# Patient Record
Sex: Male | Born: 1945 | Race: White | Hispanic: No | Marital: Married | Smoking: Former smoker
Health system: Southern US, Community
[De-identification: ages and names within clinical notes are randomized; demographics above are authoritative.]

## PROBLEM LIST (undated history)

## (undated) DIAGNOSIS — D696 Thrombocytopenia, unspecified: Secondary | ICD-10-CM

## (undated) DIAGNOSIS — D689 Coagulation defect, unspecified: Secondary | ICD-10-CM

## (undated) DIAGNOSIS — K7460 Unspecified cirrhosis of liver: Secondary | ICD-10-CM

## (undated) HISTORY — PX: CARPAL TUNNEL RELEASE: SHX101

## (undated) HISTORY — PX: COLONOSCOPY: SHX174

---

## 2016-12-02 HISTORY — PX: TIPS PROCEDURE: SHX808

## 2016-12-30 HISTORY — PX: ESOPHAGOGASTRODUODENOSCOPY: SHX1529

## 2017-07-07 ENCOUNTER — Other Ambulatory Visit: Payer: Self-pay | Admitting: Transplant Hepatology

## 2017-07-07 ENCOUNTER — Other Ambulatory Visit (HOSPITAL_COMMUNITY): Payer: Self-pay | Admitting: Transplant Hepatology

## 2017-07-07 DIAGNOSIS — K766 Portal hypertension: Secondary | ICD-10-CM

## 2017-07-09 ENCOUNTER — Ambulatory Visit (HOSPITAL_COMMUNITY)
Admission: RE | Admit: 2017-07-09 | Discharge: 2017-07-09 | Disposition: A | Discharge status: Home / Self Care | Payer: Medicare Other | Source: Ambulatory Visit | Attending: Transplant Hepatology | Admitting: Transplant Hepatology

## 2017-07-09 ENCOUNTER — Other Ambulatory Visit (HOSPITAL_COMMUNITY): Payer: Self-pay | Admitting: Transplant Hepatology

## 2017-07-09 DIAGNOSIS — K7460 Unspecified cirrhosis of liver: Secondary | ICD-10-CM | POA: Diagnosis not present

## 2017-07-09 DIAGNOSIS — I8390 Asymptomatic varicose veins of unspecified lower extremity: Secondary | ICD-10-CM | POA: Insufficient documentation

## 2017-07-09 DIAGNOSIS — K766 Portal hypertension: Secondary | ICD-10-CM

## 2017-07-29 ENCOUNTER — Inpatient Hospital Stay (HOSPITAL_COMMUNITY)
Admission: EM | Admit: 2017-07-29 | Discharge: 2017-08-04 | DRG: 872 | Disposition: E | Payer: Medicare Other | Attending: Internal Medicine | Admitting: Internal Medicine

## 2017-07-29 ENCOUNTER — Other Ambulatory Visit: Payer: Self-pay

## 2017-07-29 ENCOUNTER — Emergency Department (HOSPITAL_COMMUNITY): Payer: Medicare Other

## 2017-07-29 ENCOUNTER — Encounter (HOSPITAL_COMMUNITY): Payer: Self-pay

## 2017-07-29 ENCOUNTER — Other Ambulatory Visit (HOSPITAL_COMMUNITY): Payer: Medicare Other

## 2017-07-29 DIAGNOSIS — B377 Candidal sepsis: Secondary | ICD-10-CM | POA: Diagnosis not present

## 2017-07-29 DIAGNOSIS — R17 Unspecified jaundice: Secondary | ICD-10-CM | POA: Diagnosis not present

## 2017-07-29 DIAGNOSIS — Z825 Family history of asthma and other chronic lower respiratory diseases: Secondary | ICD-10-CM

## 2017-07-29 DIAGNOSIS — D6959 Other secondary thrombocytopenia: Secondary | ICD-10-CM | POA: Diagnosis present

## 2017-07-29 DIAGNOSIS — D696 Thrombocytopenia, unspecified: Secondary | ICD-10-CM | POA: Diagnosis not present

## 2017-07-29 DIAGNOSIS — N179 Acute kidney failure, unspecified: Secondary | ICD-10-CM | POA: Diagnosis present

## 2017-07-29 DIAGNOSIS — K7030 Alcoholic cirrhosis of liver without ascites: Secondary | ICD-10-CM | POA: Diagnosis not present

## 2017-07-29 DIAGNOSIS — K7290 Hepatic failure, unspecified without coma: Secondary | ICD-10-CM | POA: Diagnosis not present

## 2017-07-29 DIAGNOSIS — E875 Hyperkalemia: Secondary | ICD-10-CM | POA: Diagnosis present

## 2017-07-29 DIAGNOSIS — F1020 Alcohol dependence, uncomplicated: Secondary | ICD-10-CM | POA: Diagnosis present

## 2017-07-29 DIAGNOSIS — E872 Acidosis: Secondary | ICD-10-CM | POA: Diagnosis present

## 2017-07-29 DIAGNOSIS — Z801 Family history of malignant neoplasm of trachea, bronchus and lung: Secondary | ICD-10-CM

## 2017-07-29 DIAGNOSIS — K7031 Alcoholic cirrhosis of liver with ascites: Secondary | ICD-10-CM | POA: Diagnosis present

## 2017-07-29 DIAGNOSIS — D539 Nutritional anemia, unspecified: Secondary | ICD-10-CM | POA: Diagnosis not present

## 2017-07-29 DIAGNOSIS — D689 Coagulation defect, unspecified: Secondary | ICD-10-CM | POA: Diagnosis present

## 2017-07-29 DIAGNOSIS — Z87891 Personal history of nicotine dependence: Secondary | ICD-10-CM | POA: Diagnosis not present

## 2017-07-29 DIAGNOSIS — E871 Hypo-osmolality and hyponatremia: Secondary | ICD-10-CM | POA: Diagnosis present

## 2017-07-29 DIAGNOSIS — Z8249 Family history of ischemic heart disease and other diseases of the circulatory system: Secondary | ICD-10-CM

## 2017-07-29 DIAGNOSIS — R6510 Systemic inflammatory response syndrome (SIRS) of non-infectious origin without acute organ dysfunction: Secondary | ICD-10-CM | POA: Diagnosis not present

## 2017-07-29 DIAGNOSIS — I119 Hypertensive heart disease without heart failure: Secondary | ICD-10-CM | POA: Diagnosis present

## 2017-07-29 DIAGNOSIS — Z66 Do not resuscitate: Secondary | ICD-10-CM | POA: Diagnosis present

## 2017-07-29 DIAGNOSIS — E876 Hypokalemia: Secondary | ICD-10-CM | POA: Diagnosis not present

## 2017-07-29 DIAGNOSIS — R0902 Hypoxemia: Secondary | ICD-10-CM | POA: Diagnosis present

## 2017-07-29 DIAGNOSIS — Z95828 Presence of other vascular implants and grafts: Secondary | ICD-10-CM

## 2017-07-29 DIAGNOSIS — K7581 Nonalcoholic steatohepatitis (NASH): Secondary | ICD-10-CM | POA: Diagnosis present

## 2017-07-29 DIAGNOSIS — R0602 Shortness of breath: Secondary | ICD-10-CM | POA: Diagnosis not present

## 2017-07-29 DIAGNOSIS — R509 Fever, unspecified: Secondary | ICD-10-CM

## 2017-07-29 HISTORY — DX: Thrombocytopenia, unspecified: D69.6

## 2017-07-29 HISTORY — DX: Coagulation defect, unspecified: D68.9

## 2017-07-29 HISTORY — DX: Unspecified cirrhosis of liver: K74.60

## 2017-07-29 LAB — COMPREHENSIVE METABOLIC PANEL
ALT: 48 U/L (ref 17–63)
AST: 105 U/L — AB (ref 15–41)
Albumin: 2.4 g/dL — ABNORMAL LOW (ref 3.5–5.0)
Alkaline Phosphatase: 137 U/L — ABNORMAL HIGH (ref 38–126)
Anion gap: 12 (ref 5–15)
BILIRUBIN TOTAL: 8.2 mg/dL — AB (ref 0.3–1.2)
BUN: 24 mg/dL — AB (ref 6–20)
CHLORIDE: 98 mmol/L — AB (ref 101–111)
CO2: 17 mmol/L — ABNORMAL LOW (ref 22–32)
CREATININE: 1.43 mg/dL — AB (ref 0.61–1.24)
Calcium: 9 mg/dL (ref 8.9–10.3)
GFR calc Af Amer: 55 mL/min — ABNORMAL LOW (ref >60)
GFR, EST NON AFRICAN AMERICAN: 47 mL/min — AB (ref >60)
GLUCOSE: 111 mg/dL — AB (ref 65–99)
Potassium: 6.1 mmol/L — ABNORMAL HIGH (ref 3.5–5.1)
Sodium: 127 mmol/L — ABNORMAL LOW (ref 135–145)
Total Protein: 6.6 g/dL (ref 6.5–8.1)

## 2017-07-29 LAB — URINALYSIS, ROUTINE W REFLEX MICROSCOPIC
BACTERIA UA: NONE SEEN
BILIRUBIN URINE: NEGATIVE
Glucose, UA: NEGATIVE mg/dL
KETONES UR: NEGATIVE mg/dL
LEUKOCYTES UA: NEGATIVE
Nitrite: NEGATIVE
Protein, ur: NEGATIVE mg/dL
Specific Gravity, Urine: 1.015 (ref 1.005–1.030)
pH: 5 (ref 5.0–8.0)

## 2017-07-29 LAB — CBC
HCT: 25.9 % — ABNORMAL LOW (ref 39.0–52.0)
Hemoglobin: 8.9 g/dL — ABNORMAL LOW (ref 13.0–17.0)
MCH: 35.3 pg — ABNORMAL HIGH (ref 26.0–34.0)
MCHC: 34.4 g/dL (ref 30.0–36.0)
MCV: 102.8 fL — AB (ref 78.0–100.0)
PLATELETS: 86 10*3/uL — AB (ref 150–400)
RBC: 2.52 MIL/uL — ABNORMAL LOW (ref 4.22–5.81)
RDW: 15.2 % (ref 11.5–15.5)
WBC: 16.5 10*3/uL — AB (ref 4.0–10.5)

## 2017-07-29 LAB — INFLUENZA PANEL BY PCR (TYPE A & B)
INFLAPCR: NEGATIVE
INFLBPCR: NEGATIVE

## 2017-07-29 LAB — I-STAT CG4 LACTIC ACID, ED: LACTIC ACID, VENOUS: 5.91 mmol/L — AB (ref 0.5–1.9)

## 2017-07-29 LAB — PROTIME-INR
INR: 2.76
Prothrombin Time: 29 seconds — ABNORMAL HIGH (ref 11.4–15.2)

## 2017-07-29 LAB — CREATININE, URINE, RANDOM: Creatinine, Urine: 161.07 mg/dL

## 2017-07-29 LAB — LIPASE, BLOOD: LIPASE: 27 U/L (ref 11–51)

## 2017-07-29 LAB — AMMONIA: Ammonia: 50 umol/L — ABNORMAL HIGH (ref 9–35)

## 2017-07-29 LAB — SODIUM, URINE, RANDOM: Sodium, Ur: 37 mmol/L

## 2017-07-29 MED ORDER — LIDOCAINE HCL (PF) 1 % IJ SOLN
INTRAMUSCULAR | Status: AC
  Filled 2017-07-29: qty 5

## 2017-07-29 MED ORDER — PIPERACILLIN-TAZOBACTAM 3.375 G IVPB 30 MIN
3.3750 g | Freq: Once | INTRAVENOUS | Status: AC
  Administered 2017-07-29: 3.375 g via INTRAVENOUS
  Filled 2017-07-29: qty 50

## 2017-07-29 MED ORDER — PIPERACILLIN-TAZOBACTAM 3.375 G IVPB
3.3750 g | Freq: Three times a day (TID) | INTRAVENOUS | Status: DC
  Administered 2017-07-30 (×3): 3.375 g via INTRAVENOUS
  Filled 2017-07-29 (×4): qty 50

## 2017-07-29 MED ORDER — LACTULOSE 10 GM/15ML PO SOLN
10.0000 g | Freq: Every day | ORAL | Status: DC
  Administered 2017-07-30: 10 g via ORAL
  Filled 2017-07-29: qty 15

## 2017-07-29 MED ORDER — VANCOMYCIN HCL IN DEXTROSE 750-5 MG/150ML-% IV SOLN
750.0000 mg | Freq: Two times a day (BID) | INTRAVENOUS | Status: DC
  Administered 2017-07-29 – 2017-07-30 (×3): 750 mg via INTRAVENOUS
  Filled 2017-07-29 (×4): qty 150

## 2017-07-29 MED ORDER — HYDRALAZINE HCL 20 MG/ML IJ SOLN: 10.0000 mg | INTRAMUSCULAR | Status: DC | PRN

## 2017-07-29 MED ORDER — MAGNESIUM OXIDE 400 (241.3 MG) MG PO TABS
400.0000 mg | ORAL_TABLET | Freq: Every day | ORAL | Status: DC
  Administered 2017-07-30: 400 mg via ORAL
  Filled 2017-07-29: qty 1

## 2017-07-29 MED ORDER — ACETAMINOPHEN 325 MG PO TABS
325.0000 mg | ORAL_TABLET | Freq: Once | ORAL | Status: AC
  Administered 2017-07-29: 325 mg via ORAL
  Filled 2017-07-29: qty 1

## 2017-07-29 MED ORDER — ACETAMINOPHEN 650 MG RE SUPP
325.0000 mg | Freq: Four times a day (QID) | RECTAL | Status: DC | PRN
Start: 2017-07-29 — End: 2017-07-30

## 2017-07-29 MED ORDER — ONDANSETRON HCL 4 MG/2ML IJ SOLN: 4.0000 mg | Freq: Four times a day (QID) | INTRAMUSCULAR | Status: DC | PRN

## 2017-07-29 MED ORDER — GABAPENTIN 300 MG PO CAPS
300.0000 mg | ORAL_CAPSULE | Freq: Three times a day (TID) | ORAL | Status: DC
  Administered 2017-07-30 (×4): 300 mg via ORAL
  Filled 2017-07-29 (×4): qty 1

## 2017-07-29 MED ORDER — ACETAMINOPHEN 325 MG PO TABS
325.0000 mg | ORAL_TABLET | Freq: Four times a day (QID) | ORAL | Status: DC | PRN
  Administered 2017-07-30: 325 mg via ORAL
  Filled 2017-07-29: qty 1

## 2017-07-29 MED ORDER — LIDOCAINE HCL (PF) 1 % IJ SOLN: 2.0000 mL | Freq: Once | INTRAMUSCULAR | Status: DC

## 2017-07-29 MED ORDER — SODIUM CHLORIDE 0.9 % IV BOLUS (SEPSIS)
500.0000 mL | Freq: Once | INTRAVENOUS | Status: AC
  Administered 2017-07-29: 500 mL via INTRAVENOUS

## 2017-07-29 MED ORDER — RIFAXIMIN 550 MG PO TABS
550.0000 mg | ORAL_TABLET | Freq: Two times a day (BID) | ORAL | Status: DC
  Administered 2017-07-30 (×3): 550 mg via ORAL
  Filled 2017-07-29 (×4): qty 1

## 2017-07-29 MED ORDER — ONDANSETRON HCL 4 MG PO TABS: 4.0000 mg | ORAL_TABLET | Freq: Four times a day (QID) | ORAL | Status: DC | PRN

## 2017-07-29 MED ORDER — METOPROLOL TARTRATE 25 MG PO TABS
25.0000 mg | ORAL_TABLET | Freq: Two times a day (BID) | ORAL | Status: DC
  Administered 2017-07-30 (×3): 25 mg via ORAL
  Filled 2017-07-29 (×3): qty 1

## 2017-07-29 NOTE — ED Notes (Signed)
Pt went to PCP office today because of weak/fatigued feeling since Monday.

## 2017-07-29 NOTE — ED Notes (Signed)
Pt given ice chips, ok by EDP 

## 2017-07-29 NOTE — Progress Notes (Signed)
Pharmacy Antibiotic Note  Christian Swanson is a 71 y.o. male admitted on 07-21-17 with sepsis.    Plan: Zosyn 3.375 gm iv q8h Vanc 750 q12 Monitor renal fx cx vt prn  Height: 5' 6.5" (168.9 cm) Weight: 184 lb 8 oz (83.7 kg) IBW/kg (Calculated) : 64.95  Temp (24hrs), Avg:99.8 F (37.7 C), Min:99.1 F (37.3 C), Max:100.4 F (38 C)  Recent Labs  Lab 01/30/17 1747  WBC 16.5*  CREATININE 1.43*    Estimated Creatinine Clearance: 47.2 mL/min (A) (by C-G formula based on SCr of 1.43 mg/dL (H)).    No Known Allergies  Christian Swanson, PharmD, BCPS, BCCCP Clinical Pharmacist Clinical phone for 07-21-17 from 1430 [phone]- [phone]: [phone] If after 2300, please call main pharmacy at: [phone] 07-21-17 8:38 PM

## 2017-07-29 NOTE — ED Triage Notes (Signed)
Per Pt, PT is coming from PCP with complaints of abnormal labs secondary to being treated for liver disease. Reports some nausea, but no vomiting. Sodium is 129. Potassium 6.4, AST 93, Bilirubin 9.0. Pt denies chest pain.

## 2017-07-29 NOTE — ED Provider Notes (Signed)
[facility] Provider Note   CSN: [id number] Arrival date & time: 2017-04-12  1709     History   Chief Complaint Chief Complaint  Patient presents with  . Abnormal Lab  . Shortness of Breath    HPI Christian Swanson is a 71 y.o. male.  HPI  Patient 71 year old male with past medical history significant for alcoholic cirrhosis.  Patient is being managed [facility].  Patient has status post TIPS procedure.  Patient unfortunately is not a candidate for transplant given his age.  He is been managed pretty well in the past.  In his most recent appointment which was 1 month ago, patient's T bili was trending up from 3 to 6.  In addition patient's INR was steady at 2.3 and creatinine was normal at 0.7.  Patient's been feeling weak, tired, started to feel shaky and febrile today.  Went to outpatient clinic got labs drawn was sent here to the emergency department.  Patient had no focal abdominal pain or other symptoms.  Unfortunately today patient's T bili has increased from 6 to 9.  His creatinine has gone from .7 to 1.4.  Patient is febrile.  We will send labs.  However with INR elevated at 2.4 and platelets at 85. We will treat with broad-spectrum antibiotics.  Past Medical History:  Diagnosis Date  . Cirrhosis (HCC)     There are no active problems to display for this patient.   Past Surgical History:  Procedure Laterality Date  . TIPS PROCEDURE         Home Medications    Prior to Admission medications   Medication Sig Start Date End Date Taking? Authorizing Provider  aMILoride (MIDAMOR) 5 MG tablet Take 10 mg by mouth every morning.    Yes [provider]  Cholecalciferol (VITAMIN D3) 5000 units TABS Take 5,000 Units by mouth daily.   Yes [provider]  furosemide (LASIX) 20 MG tablet Take 20 mg by mouth daily.   Yes [provider]  gabapentin (NEURONTIN) 300 MG capsule  Take 300 mg by mouth 3 (three) times daily.   Yes [provider]  lactulose (CHRONULAC) 10 GM/15ML solution Take 15 mLs by mouth at bedtime.   Yes [provider]  lisinopril (PRINIVIL,ZESTRIL) 10 MG tablet Take 10 mg by mouth daily.   Yes [provider]  magnesium oxide (MAG-OX) 400 MG tablet Take 400 mg by mouth daily.   Yes [provider]  metoprolol tartrate (LOPRESSOR) 25 MG tablet Take 25 mg by mouth 2 (two) times daily. 07/10/17 07/10/18 Yes [provider]  rifaximin (XIFAXAN) 550 MG TABS tablet Take 550 mg by mouth 2 (two) times daily. 01/30/17  Yes [provider]    Family History No family history on file.  Social History Social History   Tobacco Use  . Smoking status: Former Games developermoker  . Smokeless tobacco: Never Used  Substance Use Topics  . Alcohol use: No    Frequency: Never  . Drug use: No     Allergies   Patient has no known allergies.   Review of Systems Review of Systems  Constitutional: Positive for chills, fatigue and fever.  Respiratory: Negative for chest tightness.   Gastrointestinal: Negative for abdominal pain.  Neurological: Positive for light-headedness. Negative for dizziness.  All other systems reviewed and are negative.    Physical Exam Updated Vital Signs BP 111/60   Pulse 84   Temp Marland Kitchen(!)  100.4 F (38 C) (Oral)   Resp (!) 22   Ht 5' 6.5" (1.689 m)   Wt 83.7 kg (184 lb 8 oz)   SpO2 (!) 83%   BMI 29.33 kg/m   Physical Exam  Constitutional: He is oriented to person, place, and time. He appears well-nourished.  71 year old jaundiced male.  HENT:  Head: Normocephalic.  Eyes: Conjunctivae are normal. Pupils are equal, round, and reactive to light.  Mild icterus.  Cardiovascular: Normal rate, regular rhythm and normal heart sounds.  Pulmonary/Chest: Effort normal. No respiratory distress.  Abdominal: Soft. He exhibits ascites. He exhibits no distension.  No abdominal tenderness,  enlarged liver.  Musculoskeletal:       Right lower leg: He exhibits no edema.       Left lower leg: He exhibits no edema.  Neurological: He is oriented to person, place, and time.  Skin: Skin is warm and dry. He is not diaphoretic.  Psychiatric: He has a normal mood and affect. His behavior is normal.     ED Treatments / Results  Labs (all labs ordered are listed, but only abnormal results are displayed) Labs Reviewed  COMPREHENSIVE METABOLIC PANEL - Abnormal; Notable for the following components:      Result Value   Sodium 127 (*)    Potassium 6.1 (*)    Chloride 98 (*)    CO2 17 (*)    Glucose, Bld 111 (*)    BUN 24 (*)    Creatinine, Ser 1.43 (*)    Albumin 2.4 (*)    AST 105 (*)    Alkaline Phosphatase 137 (*)    Total Bilirubin 8.2 (*)    GFR calc non Af Amer 47 (*)    GFR calc Af Amer 55 (*)    All other components within normal limits  CBC - Abnormal; Notable for the following components:   WBC 16.5 (*)    RBC 2.52 (*)    Hemoglobin 8.9 (*)    HCT 25.9 (*)    MCV 102.8 (*)    MCH 35.3 (*)    Platelets 86 (*)    All other components within normal limits  CULTURE, BLOOD (ROUTINE X 2)  CULTURE, BLOOD (ROUTINE X 2)  URINE CULTURE  LIPASE, BLOOD  INFLUENZA PANEL BY PCR (TYPE A & B)  URINALYSIS, ROUTINE W REFLEX MICROSCOPIC  AMMONIA  PROTIME-INR  I-STAT CG4 LACTIC ACID, ED    EKG  EKG Interpretation None       Radiology Dg Chest 2 View  Result Date: 2017/05/30 CLINICAL DATA:  liver disease, nausea, shortness of breath EXAM: CHEST  2 VIEW COMPARISON:  None available FINDINGS: Mild cardiomegaly with central vascular congestion. No focal pneumonia, collapse or consolidation. Negative for edema, effusion or pneumothorax. Trachea is midline. Minor thoracic spondylosis. Patient is status post TIPS. IMPRESSION: Cardiomegaly with mild vascular congestion No acute chest process. Electronically Signed   By: Judie PetitM.  Shick M.D.   On: 2017/05/30 18:32     Procedures Procedures (including critical care time)  CRITICAL CARE Performed by: Arlana Hoveourteney L Amberia Bayless Total critical care time: 45 minutes Critical care time was exclusive of separately billable procedures and treating other patients. Critical care was necessary to treat or prevent imminent or life-threatening deterioration. Critical care was time spent personally by me on the following activities: development of treatment plan with patient and/or surrogate as well as nursing, discussions with consultants, evaluation of patient's response to treatment, examination of patient, obtaining history from patient or surrogate,  ordering and performing treatments and interventions, ordering and review of laboratory studies, ordering and review of radiographic studies, pulse oximetry and re-evaluation of patient's condition.   Medications Ordered in ED Medications  sodium chloride 0.9 % bolus 500 mL (not administered)     Initial Impression / Assessment and Plan / ED Course  I have reviewed the triage vital signs and the nursing notes.  Pertinent labs & imaging results that were available during my care of the patient were reviewed by me and considered in my medical decision making (see chart for details).    Patient 71 year old male with past medical history significant for alcoholic cirrhosis.  Patient is being managed by Dr. Clelia Croft at [facility] hepatology clinic.  Patient has status post TIPS procedure.  Patient unfortunately is not a candidate for transplant given his age.  He is been managed pretty well in the past.  In his most recent appointment which was 1 month ago, patient's T bili was trending up from 3 to 6.  In addition patient's INR was steady at 2.3 and creatinine was normal at 0.7.  Patient's been feeling weak, tired, started to feel shaky and febrile today.  Went to outpatient clinic got labs drawn was sent here to the emergency department.  Patient had no focal abdominal pain  or other symptoms.  Unfortunately today patient's T bili has increased from 6 to 9.  His creatinine has gone from .7 to 1.4.  Patient is febrile.  We will send labs.  However with INR elevated at 2.4 and platelets at 85.  We will treat with broad-spectrum antibiotics.  10:12 PM Patient has a high INR and platelets, however we did an ultrasound to make sure the patient did not have any fluid that I could test.  Because we have been unable to find a source for his fever and white count.  However there is no pocket of fluid that I can visualize with ultrasound.  We will continue with these broad-spectrum antibiotics and reassess.  Final Clinical Impressions(s) / ED Diagnoses   Final diagnoses:  None    ED Discharge Orders    None       Abelino Derrick, MD 07/16/2017 2312

## 2017-07-29 NOTE — ED Notes (Signed)
Dr.Krakakandy at bedside  

## 2017-07-29 NOTE — ED Notes (Signed)
Pt oxygen saturation noted to be 84%, Pt placed on 2L [place]

## 2017-07-29 NOTE — H&P (Addendum)
History and Physical    Christian Jacquetatrick Bohle ZOX:[id number] DOB: 1946-04-01 DOA: 2017/04/02  PCP: Daiva NakayamaPa, [facility]  Patient coming from: Home.   Chief Complaint: Weakness fatigue and fever chills.  HPI: Christian Swanson is a 71 y.o. 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 Sher[facility]ll [facility] presents to the ER because of fever chills fatigue and weakness last 2 days.  Denies any nausea vomiting abdominal pain diarrhea chest pain shortness of breath productive cough.  Patient states he has been taking his medications as advised and has not had any new changes.  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  Subsequently patient underwent TIPS procedure.  Also had liver biopsy.  Patient since then has stopped drinking alcohol.  Being [facility] hepat[facility] [place].  ED Course: In the ER patient's labs reveal a lactate of 5.9.  Potassium was 6.1 sodium of 127 creatinine of 1.4 which is increased from 0.7 in June of this year.  Patient's platelets around 86 with hemoglobin around 8.9.  WBC was 16.5.  AST was 105 ALT 48 total bilirubin is 8.2.  Patient was febrile with temperatures around 102 F.  Blood cultures were obtained and empiric antibiotics were started for possible developing sepsis.  ER physician had done a bedside sonogram which did not show any pockets for paracentesis.  Chest x-ray was unremarkable UA did not show any signs of infection.  Patient received 1 L fluid bolus.  ER physician has discussed with on-call gastroenterologist Dr. Myrtie Neitheranis who will be seeing patient in consult.  Review of Systems: As per HPI, rest all negative.   Past Medical History:  Diagnosis Date  . Cirrhosis [facility](HCC)     Past Surgical History:  Procedure Laterality Date  . TIPS PROCEDURE       reports that he has quit smoking.  he has never used smokeless tobacco. He reports that he does not drink alcohol or use drugs.  No Known Allergies  Family history -COPD and CAD in his father.  Heart disease in his brother.  Hyperlipidemia in his brother.  Lung cancer in his father.  Prior to Admission medications   Medication Sig Start Date End Date Taking? Authorizing Provider  aMILoride (MIDAMOR) 5 MG tablet Take 10 mg by mouth every morning.    Yes [provider]  Cholecalciferol (VITAMIN D3) 5000 units TABS Take 5,000 Units by mouth daily.   Yes [provider]  furosemide (LASIX) 20 MG tablet Take 20 mg by mouth daily.   Yes [provider]  gabapentin (NEURONTIN) 300 MG capsule Take 300 mg by mouth 3 (three) times daily.   Yes [provider]  lactulose (CHRONULAC) 10 GM/15ML solution Take 15 mLs by mouth at bedtime.   Yes [provider]  lisinopril (PRINIVIL,ZESTRIL) 10 MG tablet Take 10 mg by mouth daily.   Yes [provider]  magnesium oxide (MAG-OX) 400 MG tablet Take 400 mg by mouth daily.   Yes [provider]  metoprolol tartrate (LOPRESSOR) 25 MG tablet Take 25 mg by mouth 2 (two) times daily. 07/10/17 07/10/18 Yes [provider]  rifaximin (XIFAXAN) 550 MG TABS tablet Take 550 mg by mouth 2 (two) times daily. 01/30/17  Yes [provider]    Physical Exam: Vitals:   Jan 18, 2017 2200 Jan 18, 2017 2230 Jan 18, 2017  2300 August 02, 2017 2305  BP: (!) 150/58 (!) 150/52 (!) 144/58   Pulse: 86 87 89   Resp: (!) 24 (!) 21 (!) 24   Temp:    (!) 102 F (38.9 C)  TempSrc:    Oral  SpO2: 98% 93% 100%   Weight:      Height:          Constitutional: Moderately built and nourished. Vitals:   02-Aug-2017 2200 2017-08-02 2230 Aug 02, 2017 2300 08/02/17 2305  BP: (!) 150/58 (!) 150/52 (!) 144/58   Pulse: 86 87 89   Resp: (!) 24 (!) 21 (!) 24   Temp:    (!) 102 F (38.9 C)  TempSrc:    Oral  SpO2: 98% 93% 100%   Weight:      Height:       Eyes: Mild  icterus no pallor. ENMT: No discharge from the ears eyes nose or mouth. Neck: No JVD appreciated no mass felt. Respiratory: No rhonchi or crepitations. Cardiovascular: S1-S2 heard no murmurs appreciated. Abdomen: Soft nontender bowel sounds present.  No definite ascites appreciated. Musculoskeletal: Bilateral lower extremity edema. Skin: No rash. Neurologic: Alert awake oriented to time place and person.  Moves all extremities. Psychiatric: Appears normal.  Normal affect.   Labs on Admission: I have personally reviewed following labs and imaging studies  CBC: Recent Labs  Lab 2017/08/02 1747  WBC 16.5*  HGB 8.9*  HCT 25.9*  MCV 102.8*  PLT 86*   Basic Metabolic Panel: Recent Labs  Lab 08-02-17 1747  NA 127*  K 6.1*  CL 98*  CO2 17*  GLUCOSE 111*  BUN 24*  CREATININE 1.43*  CALCIUM 9.0   GFR: Estimated Creatinine Clearance: 47.2 mL/min (A) (by C-G formula based on SCr of 1.43 mg/dL (H)). Liver Function Tests: Recent Labs  Lab 08/02/17 1747  AST 105*  ALT 48  ALKPHOS 137*  BILITOT 8.2*  PROT 6.6  ALBUMIN 2.4*   Recent Labs  Lab 2017/08/02 1747  LIPASE 27   Recent Labs  Lab August 02, 2017 2049  AMMONIA 50*   Coagulation Profile: Recent Labs  Lab 08-02-17 2049  INR 2.76   Cardiac Enzymes: No results for input(s): CKTOTAL, CKMB, CKMBINDEX, TROPONINI in the last 168 hours. BNP (last 3 results) No results for input(s): PROBNP in the last 8760 hours. HbA1C: No results for input(s): HGBA1C in the last 72 hours. CBG: No results for input(s): GLUCAP in the last 168 hours. Lipid Profile: No results for input(s): CHOL, HDL, LDLCALC, TRIG, CHOLHDL, LDLDIRECT in the last 72 hours. Thyroid Function Tests: No results for input(s): TSH, T4TOTAL, FREET4, T3FREE, THYROIDAB in the last 72 hours. Anemia Panel: No results for input(s): VITAMINB12, FOLATE, FERRITIN, TIBC, IRON, RETICCTPCT in the last 72 hours. Urine analysis:    Component Value Date/Time   COLORURINE  AMBER (A) 08/02/2017 2109   APPEARANCEUR CLEAR 2017-08-02 2109   LABSPEC 1.015 02-Aug-2017 2109   PHURINE 5.0 02-Aug-2017 2109   GLUCOSEU NEGATIVE Aug 02, 2017 2109   HGBUR MODERATE (A) Aug 02, 2017 2109   BILIRUBINUR NEGATIVE 2017-08-02 2109   KETONESUR NEGATIVE August 02, 2017 2109   PROTEINUR NEGATIVE Aug 02, 2017 2109   NITRITE NEGATIVE August 02, 2017 2109   LEUKOCYTESUR NEGATIVE 08/02/2017 2109   Sepsis Labs: @LABRCNTIP (procalcitonin:4,lacticidven:4) )No results found for this or any previous visit (from the past 240 hour(s)).   Radiological Exams on Admission: Dg Chest 2 View  Result Date: August 02, 2017 CLINICAL DATA:  liver disease, nausea, shortness of breath EXAM: CHEST  2 VIEW COMPARISON:  None available FINDINGS:  Mild cardiomegaly with central vascular congestion. No focal pneumonia, collapse or consolidation. Negative for edema, effusion or pneumothorax. Trachea is midline. Minor thoracic spondylosis. Patient is status post TIPS. IMPRESSION: Cardiomegaly with mild vascular congestion No acute chest process. Electronically Signed   By: Judie PetitM.  Shick M.D.   On: 07/24/2017 18:32    EKG: Independently reviewed.  Normal sinus rhythm with RBBB.  Assessment/Plan Principal Problem:   SIRS (systemic inflammatory response syndrome) (HCC) Active Problems:   Alcoholic cirrhosis of liver without ascites (HCC)   ARF (acute renal failure) (HCC)   Macrocytic anemia   Coagulopathy (HCC)    1. SIRS -source not clear.  I have ordered sonogram of the abdomen and also Doppler to check for TIPS patency.  If sonogram of the abdomen does show ascites will need paracentesis.  Patient is on vancomycin and Zosyn which was started in the ER.  Follow cultures.  Patient received 1 L fluid bolus. On exam patient has significant lower extremity edema will hold off further fluids for now. After discussing with nephrologist Dr.Deterding, patient started on Albumin infusion. 2. Acute renal failure with hyperkalemia  hyponatremia and metabolic acidosis -  we will hold diuretics amiloride and Lasix and lisinopril.  One liter fluid bolus was give in the ER.  Patient is not hypotensive.  Patient ordered albumin infusion 12.5 g for 6 doses after discussing with Nephrologist Dr.Deterding.  Follow metabolic panel closely intake output.  Patient is getting sonogram of the abdomen check for any renal obstruction.  Check urine sodium and creatinine. 3. Cirrhosis of liver status post TIPS -holding Lasix and spironolactone due to renal failure and hyperkalemia.  We will continue with Xifaxan and lactulose.  Dr. Myrtie Neitheranis on-call gastroenterologist to see.  Follow Doppler studies for TIPS patency and sonogram of the abdomen. 4. Anemia, thrombocytopenia and coagulopathy likely from cirrhosis -follow CBC and INR closely.  Since patient has macrocytosis check anemia panel. 5. Hypertension -we will hold lisinopril due to renal failure and hyperkalemia.  I have placed patient on as needed IV hydralazine. 6. History of duodenal varices status post TIPS now.   DVT prophylaxis: SCDs. Code Status: Full code. Family Communication: Discussed with patient. Disposition Plan: Home. Consults called: Gastroenterology. Admission status: Inpatient.   Eduard ClosArshad N Atira Borello MD [facility] Pager [phone].  If 7PM-7AM, please contact night-coverage [email or web address] Password [email or web address]  07/12/2017, 11:39 PM

## 2017-07-30 ENCOUNTER — Inpatient Hospital Stay (HOSPITAL_COMMUNITY): Payer: Medicare Other

## 2017-07-30 ENCOUNTER — Other Ambulatory Visit (HOSPITAL_COMMUNITY): Payer: Medicare Other

## 2017-07-30 ENCOUNTER — Encounter (HOSPITAL_COMMUNITY): Payer: Self-pay | Admitting: Physician Assistant

## 2017-07-30 ENCOUNTER — Other Ambulatory Visit: Payer: Self-pay

## 2017-07-30 DIAGNOSIS — K7030 Alcoholic cirrhosis of liver without ascites: Secondary | ICD-10-CM

## 2017-07-30 DIAGNOSIS — E875 Hyperkalemia: Secondary | ICD-10-CM

## 2017-07-30 DIAGNOSIS — E876 Hypokalemia: Secondary | ICD-10-CM

## 2017-07-30 DIAGNOSIS — D689 Coagulation defect, unspecified: Secondary | ICD-10-CM

## 2017-07-30 DIAGNOSIS — D539 Nutritional anemia, unspecified: Secondary | ICD-10-CM

## 2017-07-30 DIAGNOSIS — D696 Thrombocytopenia, unspecified: Secondary | ICD-10-CM

## 2017-07-30 DIAGNOSIS — K7290 Hepatic failure, unspecified without coma: Secondary | ICD-10-CM

## 2017-07-30 DIAGNOSIS — R17 Unspecified jaundice: Secondary | ICD-10-CM

## 2017-07-30 LAB — BASIC METABOLIC PANEL
ANION GAP: 6 (ref 5–15)
ANION GAP: 9 (ref 5–15)
ANION GAP: 13 (ref 5–15)
Anion gap: 10 (ref 5–15)
BUN: 29 mg/dL — ABNORMAL HIGH (ref 6–20)
BUN: 34 mg/dL — ABNORMAL HIGH (ref 6–20)
BUN: 37 mg/dL — ABNORMAL HIGH (ref 6–20)
BUN: 42 mg/dL — AB (ref 6–20)
CALCIUM: 8.6 mg/dL — AB (ref 8.9–10.3)
CHLORIDE: 100 mmol/L — AB (ref 101–111)
CHLORIDE: 99 mmol/L — AB (ref 101–111)
CHLORIDE: 99 mmol/L — AB (ref 101–111)
CHLORIDE: 98 mmol/L — AB (ref 101–111)
CO2: 19 mmol/L — AB (ref 22–32)
CO2: 19 mmol/L — ABNORMAL LOW (ref 22–32)
CO2: 15 mmol/L — AB (ref 22–32)
CO2: 20 mmol/L — ABNORMAL LOW (ref 22–32)
CREATININE: 1.47 mg/dL — AB (ref 0.61–1.24)
CREATININE: 1.69 mg/dL — AB (ref 0.61–1.24)
Calcium: 8.5 mg/dL — ABNORMAL LOW (ref 8.9–10.3)
Calcium: 8.9 mg/dL (ref 8.9–10.3)
Calcium: 8.6 mg/dL — ABNORMAL LOW (ref 8.9–10.3)
Creatinine, Ser: 1.4 mg/dL — ABNORMAL HIGH (ref 0.61–1.24)
Creatinine, Ser: 1.84 mg/dL — ABNORMAL HIGH (ref 0.61–1.24)
GFR calc non Af Amer: 48 mL/min — ABNORMAL LOW (ref >60)
GFR calc non Af Amer: 46 mL/min — ABNORMAL LOW (ref >60)
GFR calc non Af Amer: 38 mL/min — ABNORMAL LOW (ref >60)
GFR, EST AFRICAN AMERICAN: 56 mL/min — AB (ref >60)
GFR, EST AFRICAN AMERICAN: 53 mL/min — AB (ref >60)
GFR, EST AFRICAN AMERICAN: 45 mL/min — AB (ref >60)
GFR, EST AFRICAN AMERICAN: 40 mL/min — AB (ref >60)
GFR, EST NON AFRICAN AMERICAN: 35 mL/min — AB (ref >60)
GLUCOSE: 117 mg/dL — AB (ref 65–99)
Glucose, Bld: 90 mg/dL (ref 65–99)
Glucose, Bld: 91 mg/dL (ref 65–99)
Glucose, Bld: 93 mg/dL (ref 65–99)
POTASSIUM: 6.1 mmol/L — AB (ref 3.5–5.1)
POTASSIUM: 5.6 mmol/L — AB (ref 3.5–5.1)
Potassium: 5.8 mmol/L — ABNORMAL HIGH (ref 3.5–5.1)
Potassium: 6.7 mmol/L (ref 3.5–5.1)
SODIUM: 127 mmol/L — AB (ref 135–145)
SODIUM: 127 mmol/L — AB (ref 135–145)
SODIUM: 128 mmol/L — AB (ref 135–145)
Sodium: 125 mmol/L — ABNORMAL LOW (ref 135–145)

## 2017-07-30 LAB — CBC WITH DIFFERENTIAL/PLATELET
BASOS PCT: 1 %
Basophils Absolute: 0.1 10*3/uL (ref 0.0–0.1)
EOS ABS: 0.3 10*3/uL (ref 0.0–0.7)
EOS PCT: 3 %
HCT: 24.5 % — ABNORMAL LOW (ref 39.0–52.0)
HEMOGLOBIN: 8.7 g/dL — AB (ref 13.0–17.0)
LYMPHS ABS: 2 10*3/uL (ref 0.7–4.0)
Lymphocytes Relative: 16 %
MCH: 36.3 pg — AB (ref 26.0–34.0)
MCHC: 35.5 g/dL (ref 30.0–36.0)
MCV: 102.1 fL — ABNORMAL HIGH (ref 78.0–100.0)
MONO ABS: 0.9 10*3/uL (ref 0.1–1.0)
MONOS PCT: 7 %
Neutro Abs: 9.3 10*3/uL — ABNORMAL HIGH (ref 1.7–7.7)
Neutrophils Relative %: 73 %
Platelets: 58 10*3/uL — ABNORMAL LOW (ref 150–400)
RBC: 2.4 MIL/uL — ABNORMAL LOW (ref 4.22–5.81)
RDW: 15.3 % (ref 11.5–15.5)
WBC: 12.5 10*3/uL — ABNORMAL HIGH (ref 4.0–10.5)

## 2017-07-30 LAB — GLUCOSE, CAPILLARY
GLUCOSE-CAPILLARY: 142 mg/dL — AB (ref 65–99)
GLUCOSE-CAPILLARY: 86 mg/dL (ref 65–99)

## 2017-07-30 LAB — MRSA PCR SCREENING: MRSA by PCR: NEGATIVE

## 2017-07-30 LAB — APTT: aPTT: 53 seconds — ABNORMAL HIGH (ref 24–36)

## 2017-07-30 LAB — HEPATIC FUNCTION PANEL
ALBUMIN: 2.2 g/dL — AB (ref 3.5–5.0)
ALK PHOS: 112 U/L (ref 38–126)
ALT: 42 U/L (ref 17–63)
AST: 96 U/L — AB (ref 15–41)
BILIRUBIN DIRECT: 2.3 mg/dL — AB (ref 0.1–0.5)
BILIRUBIN TOTAL: 8 mg/dL — AB (ref 0.3–1.2)
Indirect Bilirubin: 5.7 mg/dL — ABNORMAL HIGH (ref 0.3–0.9)
Total Protein: 5.7 g/dL — ABNORMAL LOW (ref 6.5–8.1)

## 2017-07-30 LAB — PROCALCITONIN: Procalcitonin: 0.53 ng/mL

## 2017-07-30 LAB — PROTIME-INR
INR: 3.14
Prothrombin Time: 32 seconds — ABNORMAL HIGH (ref 11.4–15.2)

## 2017-07-30 LAB — LACTIC ACID, PLASMA
LACTIC ACID, VENOUS: 3.9 mmol/L — AB (ref 0.5–1.9)
Lactic Acid, Venous: 3.4 mmol/L (ref 0.5–1.9)

## 2017-07-30 MED ORDER — SODIUM BICARBONATE 650 MG PO TABS
650.0000 mg | ORAL_TABLET | Freq: Three times a day (TID) | ORAL | Status: DC
  Administered 2017-07-30: 650 mg via ORAL
  Filled 2017-07-30 (×2): qty 1

## 2017-07-30 MED ORDER — LACTULOSE 10 GM/15ML PO SOLN
20.0000 g | Freq: Three times a day (TID) | ORAL | Status: DC
  Filled 2017-07-30: qty 30

## 2017-07-30 MED ORDER — FUROSEMIDE 10 MG/ML IJ SOLN
40.0000 mg | Freq: Once | INTRAMUSCULAR | Status: AC
  Administered 2017-07-30: 40 mg via INTRAVENOUS

## 2017-07-30 MED ORDER — SODIUM POLYSTYRENE SULFONATE PO POWD
45.0000 g | Freq: Once | ORAL | Status: AC
  Administered 2017-07-30: 45 g via ORAL
  Filled 2017-07-30: qty 45

## 2017-07-30 MED ORDER — ALBUMIN HUMAN 25 % IV SOLN
12.5000 g | Freq: Four times a day (QID) | INTRAVENOUS | Status: DC
  Administered 2017-07-30 (×3): 12.5 g via INTRAVENOUS
  Filled 2017-07-30 (×5): qty 50

## 2017-07-30 MED ORDER — SODIUM BICARBONATE 8.4 % IV SOLN
50.0000 meq | Freq: Once | INTRAVENOUS | Status: AC
  Administered 2017-07-30: 50 meq via INTRAVENOUS
  Filled 2017-07-30: qty 50

## 2017-07-30 MED ORDER — SODIUM POLYSTYRENE SULFONATE PO POWD
45.0000 g | Freq: Once | ORAL | Status: AC
  Administered 2017-07-30: 45 g via ORAL

## 2017-07-30 MED ORDER — FUROSEMIDE 10 MG/ML IJ SOLN
INTRAMUSCULAR | Status: AC
  Filled 2017-07-30: qty 4

## 2017-07-30 MED ORDER — ACETAMINOPHEN 325 MG PO TABS
650.0000 mg | ORAL_TABLET | ORAL | Status: DC | PRN
  Administered 2017-07-30: 650 mg via ORAL
  Filled 2017-07-30: qty 2

## 2017-07-30 MED ORDER — DEXTROSE 50 % IV SOLN
1.0000 | Freq: Once | INTRAVENOUS | Status: AC
  Administered 2017-07-30: 50 mL via INTRAVENOUS
  Filled 2017-07-30: qty 50

## 2017-07-30 MED ORDER — ORAL CARE MOUTH RINSE
15.0000 mL | Freq: Two times a day (BID) | OROMUCOSAL | Status: DC
  Administered 2017-07-30 (×2): 15 mL via OROMUCOSAL

## 2017-07-30 MED ORDER — INSULIN ASPART 100 UNIT/ML ~~LOC~~ SOLN
10.0000 [IU] | Freq: Once | SUBCUTANEOUS | Status: AC
  Administered 2017-07-30: 10 [IU] via INTRAVENOUS

## 2017-07-30 MED ORDER — SODIUM POLYSTYRENE SULFONATE 15 GM/60ML PO SUSP
45.0000 g | Freq: Once | ORAL | Status: DC
  Filled 2017-07-30: qty 180

## 2017-07-30 MED ORDER — LACTULOSE 10 GM/15ML PO SOLN
30.0000 g | Freq: Three times a day (TID) | ORAL | Status: DC
  Administered 2017-07-30: 30 g via ORAL
  Filled 2017-07-30: qty 45

## 2017-07-30 MED ORDER — FUROSEMIDE 10 MG/ML IJ SOLN: 20.0000 mg | Freq: Once | INTRAMUSCULAR | Status: DC

## 2017-07-30 NOTE — Progress Notes (Addendum)
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 Sherryll Burger at [facility].  Based on notes from [facility] it appears that the patient is not a candidate for transplant.  Patient presented with fever chills fatigue and weakness over the last 2 days.  Patient was found to have lactic acidosis, hyponatremia and hyperkalemia.  He was noted to be febrile.  Patient was hospitalized for further management.  Reason for Visit: SIRS  Consultants: Gastroenterology  Procedures: None so far  Antibiotics: Vancomycin and Zosyn  Subjective/Interval History: Patient feels a little fatigued.  Overall he states that he is feeling slightly better this morning compared to yesterday.  Denies any nausea vomiting.  Mentions of abdominal pain.  Denies any cough or other respiratory symptoms.  No diarrhea.  Denies any dysuria.  ROS: No headaches.  Objective:  Vital Signs  Vitals:   07/30/17 0142 07/30/17 0412 07/30/17 0900 07/30/17 0924  BP: (!) 119/39 (!) 138/55  117/76  Pulse: 80 96  88  Resp:  (!) 26  19  Temp:  (!) 101.3 F (38.5 C) 100 F (37.8 C)   TempSrc:  Oral Oral   SpO2:  93%  92%  Weight:  86.1 kg (189 lb 13.1 oz)    Height:        Intake/Output Summary (Last 24 hours) at 07/30/2017 1200 Last data filed at 07/30/2017 8119 Gross per 24 hour  Intake 900 ml  Output 200 ml  Net 700 ml   Filed Weights   August 10, 2017 1739 07/30/17 0412  Weight: 83.7 kg (184 lb 8 oz) 86.1 kg (189 lb 13.1 oz)    General appearance: alert, cooperative, appears stated age, distracted and no distress Head: Normocephalic, without obvious abnormality, atraumatic Throat: lips, mucosa, and tongue normal; teeth and gums normal Resp: Diminished air entry at the bases.   No wheezing rales or rhonchi.  Normal effort. Cardio: regular rate and rhythm, S1, S2 normal, no murmur, click, rub or gallop GI: Abdomen is soft.  Nontender nondistended.  Bowel sounds are present.  No obvious masses or organomegaly. Extremities: Does have about 1+ pitting edema bilateral lower extremities. Pulses: 2+ and symmetric Skin: He is noted to be jaundiced. Neurologic: Awake alert.  Mildly distracted.  No obvious focal neurological deficits.  No asterixis is noted.  Lab Results:  Data Reviewed: I have personally reviewed following labs and imaging studies  CBC: Recent Labs  Lab 2017-08-10 1747 07/30/17 0242  WBC 16.5* 12.5*  NEUTROABS  --  9.3*  HGB 8.9* 8.7*  HCT 25.9* 24.5*  MCV 102.8* 102.1*  PLT 86* 58*    Basic Metabolic Panel: Recent Labs  Lab 10-Aug-2017 1747 07/30/17 0242 07/30/17 0932  NA 127* 125* 127*  K 6.1* 5.8* 6.7*  CL 98* 100* 99*  CO2 17* 19* 19*  GLUCOSE 111* 117* 90  BUN 24* 29* 34*  CREATININE 1.43* 1.40* 1.47*  CALCIUM 9.0 8.5* 8.6*    GFR: Estimated Creatinine Clearance: 46.5 mL/min (A) (by C-G formula based on SCr of 1.47 mg/dL (H)).  Liver Function Tests: Recent Labs  Lab 10-Aug-2017 1747 07/30/17 0242  AST 105* 96*  ALT 48 42  ALKPHOS 137* 112  BILITOT 8.2* 8.0*  PROT 6.6 5.7*  ALBUMIN 2.4* 2.2*    Recent Labs  Lab 10-Aug-2017  1747  LIPASE 27   Recent Labs  Lab 07/12/2017 2049  AMMONIA 50*    Coagulation Profile: Recent Labs  Lab 07/19/2017 2049 07/30/17 0242  INR 2.76 3.14     Recent Results (from the past 240 hour(s))  MRSA PCR Screening     Status: None   Collection Time: 07/30/17  1:53 AM  Result Value Ref Range Status   MRSA by PCR NEGATIVE NEGATIVE Final    Comment:        The GeneXpert MRSA Assay (FDA approved for NASAL specimens only), is one component of a comprehensive MRSA colonization surveillance program. It is not intended to diagnose MRSA infection nor to guide or monitor treatment for MRSA  infections.       Radiology Studies: Dg Chest 2 View  Result Date: 07/06/2017 CLINICAL DATA:  liver disease, nausea, shortness of breath EXAM: CHEST  2 VIEW COMPARISON:  None available FINDINGS: Mild cardiomegaly with central vascular congestion. No focal pneumonia, collapse or consolidation. Negative for edema, effusion or pneumothorax. Trachea is midline. Minor thoracic spondylosis. Patient is status post TIPS. IMPRESSION: Cardiomegaly with mild vascular congestion No acute chest process. Electronically Signed   By: Judie PetitM.  Shick M.D.   On: 07/16/2017 18:32   [place] Abdomen Complete  Result Date: 07/30/2017 CLINICAL DATA:  Fever.  Cirrhosis.  History of TIPS in May 2018. EXAM: ABDOMEN ULTRASOUND COMPLETE COMPARISON:  07/09/2017 abdominal sonogram. FINDINGS: Gallbladder: No gallstones, significant pericholecystic fluid or definite gallbladder wall thickening visualized. Small amount of layering sludge in the gallbladder. No sonographic Murphy sign noted by sonographer. Common bile duct: Diameter: 3 mm Liver: Liver surface is diffusely irregular and liver parenchyma is diffusely coarsened in echotexture, compatible with cirrhosis. No liver mass demonstrated. Main portal vein is patent on color Doppler imaging with normal direction of blood flow towards the liver. IVC: No abnormality visualized. Pancreas: Visualized portion unremarkable. Spleen: Mildly enlarged spleen (14.4 cm length, 547 cc volume). No splenic mass. Right Kidney: Length: 12.1 cm. Echogenicity within normal limits. No mass or hydronephrosis visualized. Left Kidney: Length: 13.4 cm. Echogenicity within normal limits. No mass or hydronephrosis visualized. Abdominal aorta: No aneurysm visualized. Other findings: None. IMPRESSION: 1. Hepatic cirrhosis.  No liver mass. 2. Mild splenomegaly. 3. Gallbladder sludge.  No cholelithiasis. Electronically Signed   By: Delbert PhenixJason A Poff M.D.   On: 07/30/2017 08:41   [place] Liver Doppler  Result Date:  07/30/2017 CLINICAL DATA:  Fever. TIPS placement May 2018. Cirrhosis. Evaluate patency. EXAM: DUPLEX ULTRASOUND OF LIVER TECHNIQUE: Color and duplex Doppler ultrasound was performed to evaluate the hepatic in-flow and out-flow vessels. Technologist describes technically difficult examination secondary to patient confusion and inability to cooperate with breathing instructions. COMPARISON:  None. FINDINGS: Portal Vein Velocities Main:  60 cm/sec, hepatopetal Right:  Not identified Left:  42  cm/sec, hepatofugal TIPS velocities (all antegrade hepatofugal) Proximal 198 cm/sec Mid 201 cm/sec Distal 169 cm/sec Hepatic Vein Velocities Right:    Not visualized Middle:  40 cm/sec, hepatofugal Left:  89 cm/sec, hepatofugal Hepatic Artery Velocity:  247 cm/sec Splenic Vein Velocity:  Not visualized Superior mesenteric vein velocity 62 cm/sec Varices: None identified Ascites: Trace perihepatic IMPRESSION: 1. Patent TIPS shunt. Elevated velocities through the stent since prior study 19 days ago. If there is clinical concern of inadequate TIPS function, consider venography and pressure measurement, possible revision. Electronically Signed   By: Corlis Leak  Hassell M.D.   On: 07/30/2017 10:19     Medications:  Scheduled: . gabapentin  300 mg Oral  TID  . lactulose  10 g Oral QHS  . magnesium oxide  400 mg Oral Daily  . mouth rinse  15 mL Mouth Rinse BID  . metoprolol tartrate  25 mg Oral BID  . rifaximin  550 mg Oral BID  . sodium polystyrene  45 g Oral Once   Continuous: . albumin human    . piperacillin-tazobactam (ZOSYN)  IV Stopped (07/30/17 1000)  . vancomycin Stopped (07/30/17 1055)   ZOX:[id number]:acetaminophen **OR** acetaminophen, hydrALAZINE, ondansetron **OR** ondansetron (ZOFRAN) IV  Assessment/Plan:  Principal Problem:   SIRS (systemic inflammatory response syndrome) (HCC) Active Problems:   Alcoholic cirrhosis of liver without ascites (HCC)   ARF (acute renal failure) (HCC)   Macrocytic anemia    Coagulopathy (HCC)    Fever/SIRS Patient noted to be febrile.  He has leukocytosis.  Also noted to have lactic acidosis which could be from his liver dysfunction.  Pro-calcitonin level is 0.53 which is reassuring.  Blood cultures are pending.  Continue vancomycin and Zosyn for now.  No significant ascites is noted.  He remains hemodynamically stable.  Influenza PCR was negative.  UA did not show any infection.  Acute renal failure with hyponatremia hyperkalemia metabolic acidosis No old labs in our system.  Care-everywhere was reviewed.  Results from blood work done in June are available.  Creatinine was 0.7 at that time.  Sodium 132.  Potassium 4.1.  Bicarbonate was 26.  Patient was noted to be on diuretics at home including amiodarone and furosemide.  Patient was also on lisinopril.  He was given 1 L fluid bolus in the ED.  Patient is normotensive.  No further fluids considering edema.  Case was briefly discussed with nephrology and patient was ordered for albumin infusions.  EKG did not show any changes of hyperkalemia.  Potassium continues to be high.  Probably secondary to potassium sparing diuretic along with ACE inhibitor.  It does not appear like patient was given any Kayexalate.  We will order high-dose Kayexalate.  D50 and insulin will also be ordered.  Repeat labs later today.  If potassium continues to remain high we may have to consult nephrology.  Monitor urine output.  Urine sodium level 37.  Ultrasound does not show any hydronephrosis.  Holding his diuretics and ACE inhibitor.  Monitor sodium levels.  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 Liver Doppler study shows patent TIPS.  Diuretics on hold on hold as discussed above.  Continue Xifaxan and lactulose.  Ammonia level 50.  Total bilirubin 8.0.  Based on care-everywhere bilirubin was 3 in June.  Meld score appears to be 30.  Gastroenterology was consulted.  Await their input.  Macrocytic anemia and  thrombocytopenia No evidence for overt bleeding.  Low platelets most likely due to liver cirrhosis.  Platelet counts have been as low as 80s previously.  Patient also noted to be coagulopathic with INR of 3.14.  INR was 2.4 in May.  Could consider vitamin K.  Will wait for GI input.  History of essential hypertension Holding his lisinopril due to reasons discussed above.  Monitor blood pressures closely.  History of duodenal varices Stable.  No evidence of bleeding.  Status post TIPS.  ADDENDUM Called by nurse as the patient was feeling anxious.  Over the last 2 hours patient has been noted to be hypoxic with saturations dropping to the 70s and 80's.  Patient was seen at bedside.  Patient has crackles in his lungs.  Patient feels like he is short of  breath.  Denies any chest pain.  Patient has not made much urine over the course of the day today.  Stat chest x-ray was done.  This suggests fluid overload.  Patient will be given 1 dose of IV Lasix.  CODE STATUS discussed with patient. Also discussed with the patient's spouse who was at the bedside.  Patient made it very clear that he did not want of any kind of breathing machine.  Previously he has expressed a wish not to be resuscitated.  BiPAP was also discussed.  Patient feels very claustrophobic so this may not be an option but could be considered if need arises.  In view of all of the above discussions patient will be DNR.  If patient continues to decompensate main goal will be to keep him comfortable.  We will continue to monitor closely. Guarded prognosis communicated to patient's spouse.  Osvaldo Shipper 4:07 PM   DVT Prophylaxis: Patient is coagulopathic with elevated INR    Code Status: Full code. Changed to DNR as discussed above. Family Communication: Discussed with the patient and his significant other Disposition Plan: Management as outlined above.  Appears to have poor prognosis with high meld score.  Wait for GI input.    LOS: 1  day   Osvaldo Shipper  [facility] Pager [phone] 07/30/2017, 12:00 PM  If 7PM-7AM, please contact night-coverage at [email or web address], password [email or web address]

## 2017-07-30 NOTE — Consult Note (Signed)
[facility] Consult: 2:24 PM 07/30/2017  LOS: 1 day    Referring Provider: Dr Barnie DelG Krishnan.    Primary Care Physician:  Daiva NakayamaPa, [facility] Primary Gastroenterologist:  Dr Pervis HockingNeil Shah at [facility].      Reason for Consultation: Decompensated hepatic cirrhosis with hepatic encephalopathy  HPI: Christian Swanson is a 71 y.o. male.  Hx adenomatous colon polyp, last colonoscopy age 71.  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  Hepatitis ABC negative.  Some of his autoimmune markers were elevated.  ANA was negative.  Mitochondrial antibody 30.1 (25 is positive), smooth muscle antibody 27 (20- 20 is weekly positive).  Alpha 1 fetoprotein normal 12/2016  Diagnosed with cirrhosis during admission to hospital in [place] in spring 2018.  Cirrhotic complications include ascites, HE, GI bleed with coffee ground emesis, coagulopathy, thrombocytopenia.  S/P 12/30/16 EGD : Bleeding in area of Barrett's esophagus treated with epinephrine injection and bipolar cautery, non-bleeding duodenal varices noted.   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  Blood loss anemia requiring PRBCs at time of GI bleed.  Lactulose and rifaximin started for HEENT 12/30/16 paracentesis, fluid with 61 WBCs  02/2017 liver biopsy at [facility] [place]  Biopsy showed cirrhosis with mild residual portal and interfaced hepatitis, mild steatosis without steatohepatitis.  [place]  [facility]  He was referred to the [facility]pat[facility] was first seen there on 06/24/17.  Dr. Sherryll BurgerShah notes that patient not a transplant candidate due to  his age but pt "does appear somewhat robust and compensated today and may not require live transplant".  At that office visit Lasix 20 mg daily was added to his regimen as the patient was complaining of lower extremity edema, low-sodium diet was reinforced.  AFP level was not elevated.  Hepatitis A IgG was reactive.  Hepatitis B surface Ab nonreactive.  Hepatitis B surface Ab quant<8.  Hepatitis B core total Ab  Nonreactive.  Hgb 9.1 on 06/24/17 Dr. Clelia CroftShaw planned future EGD 08/09/17 for surveillance purposes. Patient has been abstinent from alcohol since January 2018 when he was told his liver function was off.    Labs revealed coagulopathy, thrombocytopenia, acute kidney injury with hyperkalemia.  Elevated ammonia level.  Macrocytic anemia. Abdominal ultrasound shows cirrhosis, splenomegaly, gallbladder sludge,.. Doppler studies show patent TIPS however velocities through the stents are elevated compared with 19 days previous ultrasound.  For many weeks patient has been somnolent.  He has not been confused just sleeping a lot.  He has stable DOE when he climbs the stairs at his two-story home.  He has been having 2 or 3 bowel movements a day.  No falls.  Does not require assistance to walk or for ADLs. Patient was sent to the hospital by his PCP today because of abnormal labs particularly a sodium of 129, potassium 6.4.  Bilirubin 9, AST 93.  Gone to the office complaining  of weakness and fatigue,  for the last few days in the ED he was placed on nasal cannula oxygen as his O2 sats were 84%  Past Medical History:  Diagnosis Date  . Cirrhosis (HCC)   . Coagulopathy (HCC)   . Thrombocytopenia (HCC)     Past Surgical History:  Procedure Laterality Date  . CARPAL TUNNEL RELEASE    . COLONOSCOPY     Adenomatous colon polyps.  Latest colonoscopy as of 07/2017 performed at age 38 in [place].  . ESOPHAGOGASTRODUODENOSCOPY  12/30/2016   Done in [place].  Indication was coffee ground and  hematemesis.  There was bleeding in air in area of Barrett's esophagus that was treated with epinephrine injection and bipolar cautery.  Nonbleeding duodenal varices noted as well.  Marland Kitchen TIPS PROCEDURE  12/2016   With BRTO for nonbleeding duodenal varices.  Performed in [facility]    Prior to Admission medications   Medication Sig Start Date End Date Taking? Authorizing Provider  aMILoride (MIDAMOR) 5 MG tablet Take 10 mg by mouth every morning.    Yes [provider]  Cholecalciferol (VITAMIN D3) 5000 units TABS Take 5,000 Units by mouth daily.   Yes [provider]  furosemide (LASIX) 20 MG tablet Take 20 mg by mouth daily.   Yes [provider]  gabapentin (NEURONTIN) 300 MG capsule Take 300 mg by mouth 3 (three) times daily.   Yes [provider]  lactulose (CHRONULAC) 10 GM/15ML solution Take 15 mLs by mouth at bedtime.   Yes [provider]  lisinopril (PRINIVIL,ZESTRIL) 10 MG tablet Take 10 mg by mouth daily.   Yes [provider]  magnesium oxide (MAG-OX) 400 MG tablet Take 400 mg by mouth daily.   Yes [provider]  metoprolol tartrate (LOPRESSOR) 25 MG tablet Take 25 mg by mouth 2 (two) times daily. 07/10/17 07/10/18 Yes [provider]  rifaximin (XIFAXAN) 550 MG TABS tablet Take 550 mg by mouth 2 (two) times daily. 01/30/17  Yes [provider]    Scheduled Meds: . gabapentin  300 mg Oral TID  . lactulose  10 g Oral QHS  . magnesium oxide  400 mg Oral Daily  . mouth rinse  15 mL Mouth Rinse BID  . metoprolol tartrate  25 mg Oral BID  . rifaximin  550 mg Oral BID   Infusions: . albumin human    . piperacillin-tazobactam (ZOSYN)  IV 3.375 g (07/30/17 1255)  . vancomycin Stopped (07/30/17 1055)   PRN Meds: acetaminophen **OR** acetaminophen, hydrALAZINE, ondansetron **OR** ondansetron (ZOFRAN) IV   Allergies as of 07/11/2017  . (No Known Allergies)    History reviewed. No pertinent  family history.  Social History   Socioeconomic History  . Marital status: Married.  Patient is gay and legally married to his spouse.    Spouse name: Not on file  . Number of children:  Has a adult child who lives in [place].  . Years of education: Not on file  . Highest education level: Not on file  Social Needs  . Financial resource strain: Not on file  . Food insecurity - worry: Not on file  . Food insecurity - inability: Not on file  . Transportation needs - medical: Not on file  . Transportation needs - non-medical: Not on file  Occupational History  .  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  Tobacco Use  . Smoking status: Former Games developer  . Smokeless  tobacco: Never Used  Substance and Sexual Activity  . Alcohol use: No    Frequency: Never  . Drug use: No  . Sexual activity: Not on file  Other Topics Concern  . Not on file  Social History Narrative  . Not on file    REVIEW OF SYSTEMS: Constitutional: Somnolence, weakness. ENT:  No nose bleeds Pulm: Per HPI. CV:  No palpitations.  Chest pain.  General edema especially in the lower extremity has improved in the last few months. GU:  No hematuria, no frequency GI:  Per HPI.  Appetite poor.  No nausea or vomiting.  No abdominal pain. Heme: No excessive bleeding or bruising. Transfusions:  Per HPI MS: Severe cramping, especially in his hands which almost lock up with cramps. Neuro:  No headaches, no peripheral tingling or numbness.  No seizures. Derm:  No itching, no rash or sores.  Endocrine:  No sweats or chills.  No polyuria or dysuria Travel: Moved from [place] to [place] within the past couple of months.   PHYSICAL EXAM: Vital signs in last 24 hours: Vitals:   07/30/17 0924 07/30/17 1214  BP: 117/76 (!) 122/53  Pulse: 88 88  Resp: 19 20  Temp:  99.5 F (37.5 C)  SpO2: 92% 92%   Wt Readings from Last 3 Encounters:  07/30/17 86.1 kg (189 lb 13.1 oz)      General: Jaundiced appearing patient laying on his side in the bed.  Stuporous. Head: No signs of head trauma.  No facial swelling or asymmetry. Eyes: Icteric sclera, no conjunctival pallor Ears: Does not seem to be hard of hearing Nose: No congestion or discharge Mouth: Tongue midline.  Good dentition.  Slightly dry oral mucous membranes and lips. Neck: No JVD.  No thyromegaly.  No masses Lungs: Clear bilaterally.  Oxygen facemask in place.  No labored breathing or cough Heart: RRR.  No MRG.  S1, S2 present. Abdomen: Soft.  Not tender.  No distention.  Left lobe of liver enlarged, no hernias, bruits, masses.  Bowel sounds active..   Rectal: Did not perform rectal exam. Musc/Skeltl: No joint erythema, swelling or significant deformities. Extremities:  1 - 2+ pedal edema. Neurologic: Patient is confused.  He responds to his name but is not able to tell me where he is or what year it is. Skin: Jaundiced Tattoos: None seen Nodes: No cervical adenopathy.   Intake/Output from previous day: 12/26 0701 - 12/27 0700 In: 700 [IV Piggyback:700] Out: 200 [Urine:200] Intake/Output this shift: Total I/O In: 200 [IV Piggyback:200] Out: -   LAB RESULTS: Recent Labs    08/03/2017 1747 07/30/17 0242  WBC 16.5* 12.5*  HGB 8.9* 8.7*  HCT 25.9* 24.5*  PLT 86* 58*   BMET Lab Results  Component Value Date   NA 127 (L) 07/30/2017   NA 125 (L) 07/30/2017   NA 127 (L) 07/17/2017   K 6.7 (HH) 07/30/2017   K 5.8 (H) 07/30/2017   K 6.1 (H) 07/30/2017   CL 99 (L) 07/30/2017   CL 100 (L) 07/30/2017   CL 98 (L) 08/03/2017   CO2 19 (L) 07/30/2017   CO2 19 (L) 07/30/2017   CO2 17 (L) 07/20/2017   GLUCOSE 90 07/30/2017   GLUCOSE 117 (H) 07/30/2017   GLUCOSE 111 (H) 07/04/2017   BUN 34 (H) 07/30/2017   BUN 29 (H) 07/30/2017   BUN 24 (H) 07/30/2017   CREATININE 1.47 (H) 07/30/2017   CREATININE 1.40 (H) 07/30/2017   CREATININE 1.43 (H) 08/03/2017  CALCIUM 8.6 (L) 07/30/2017   CALCIUM  8.5 (L) 07/30/2017   CALCIUM 9.0 07/30/2017   LFT Recent Labs    2017-07-30 1747 07/30/17 0242  PROT 6.6 5.7*  ALBUMIN 2.4* 2.2*  AST 105* 96*  ALT 48 42  ALKPHOS 137* 112  BILITOT 8.2* 8.0*  BILIDIR  --  2.3*  IBILI  --  5.7*  Labs from [facility] on 06/24/2017 (found in care everywhere) sodium 136 creatinine 0.7 potassium 5.8 AST 92 ALT 55 alkaline phosphatase 278 total bilirubin 6.0, INR 2.37  PT/INR Lab Results  Component Value Date   INR 3.14 07/30/2017   INR 2.76 07-30-2017   Hepatitis Panel No results for input(s): HEPBSAG, HCVAB, HEPAIGM, HEPBIGM in the last 72 hours. C-Diff No components found for: CDIFF Lipase     Component Value Date/Time   LIPASE 27 07/30/17 1747    Drugs of Abuse  No results found for: LABOPIA, COCAINSCRNUR, LABBENZ, AMPHETMU, THCU, LABBARB   RADIOLOGY STUDIES: Dg Chest 2 View  Result Date: Jul 30, 2017 CLINICAL DATA:  liver disease, nausea, shortness of breath EXAM: CHEST  2 VIEW COMPARISON:  None available FINDINGS: Mild cardiomegaly with central vascular congestion. No focal pneumonia, collapse or consolidation. Negative for edema, effusion or pneumothorax. Trachea is midline. Minor thoracic spondylosis. Patient is status post TIPS. IMPRESSION: Cardiomegaly with mild vascular congestion No acute chest process. Electronically Signed   By: Judie Petit.  Shick M.D.   On: 07/30/17 18:32   US Abdomen Complete  Result Date: 07/30/2017 CLINICAL DATA:  Fever.  Cirrhosis.  History of TIPS in May 2018. EXAM: ABDOMEN ULTRASOUND COMPLETE COMPARISON:  07/09/2017 abdominal sonogram. FINDINGS: Gallbladder: No gallstones, significant pericholecystic fluid or definite gallbladder wall thickening visualized. Small amount of layering sludge in the gallbladder. No sonographic Murphy sign noted by sonographer. Common bile duct: Diameter: 3 mm Liver: Liver surface is diffusely irregular and liver parenchyma is diffusely coarsened in echotexture, compatible with  cirrhosis. No liver mass demonstrated. Main portal vein is patent on color Doppler imaging with normal direction of blood flow towards the liver. IVC: No abnormality visualized. Pancreas: Visualized portion unremarkable. Spleen: Mildly enlarged spleen (14.4 cm length, 547 cc volume). No splenic mass. Right Kidney: Length: 12.1 cm. Echogenicity within normal limits. No mass or hydronephrosis visualized. Left Kidney: Length: 13.4 cm. Echogenicity within normal limits. No mass or hydronephrosis visualized. Abdominal aorta: No aneurysm visualized. Other findings: None. IMPRESSION: 1. Hepatic cirrhosis.  No liver mass. 2. Mild splenomegaly. 3. Gallbladder sludge.  No cholelithiasis. Electronically Signed   By: Delbert Phenix M.D.   On: 07/30/2017 08:41   US Liver Doppler  Result Date: 07/30/2017 CLINICAL DATA:  Fever. TIPS placement May 2018. Cirrhosis. Evaluate patency. EXAM: DUPLEX ULTRASOUND OF LIVER TECHNIQUE: Color and duplex Doppler ultrasound was performed to evaluate the hepatic in-flow and out-flow vessels. Technologist describes technically difficult examination secondary to patient confusion and inability to cooperate with breathing instructions. COMPARISON:  None. FINDINGS: Portal Vein Velocities Main:  60 cm/sec, hepatopetal Right:  Not identified Left:  42  cm/sec, hepatofugal TIPS velocities (all antegrade hepatofugal) Proximal 198 cm/sec Mid 201 cm/sec Distal 169 cm/sec Hepatic Vein Velocities Right:    Not visualized Middle:  40 cm/sec, hepatofugal Left:  89 cm/sec, hepatofugal Hepatic Artery Velocity:  247 cm/sec Splenic Vein Velocity:  Not visualized Superior mesenteric vein velocity 62 cm/sec Varices: None identified Ascites: Trace perihepatic IMPRESSION: 1. Patent TIPS shunt. Elevated velocities through the stent since prior study 19 days ago. If there is clinical concern of  inadequate TIPS function, consider venography and pressure measurement, possible revision. Electronically Signed   By: Corlis Leak   Hassell M.D.   On: 07/30/2017 10:19     IMPRESSION:   *   Cirrhosis of the liver with multiple associated issues as per HPI.  Marland Kitchen. MELD score 30.  Not a transplant candidate due to age. Discriminant fx is 100 but ETOH abstinent since 08/2016   *    Hepatic encephalopathy by history and acutely at present.  *    AKI.   Hyperkalemia.  kayexelate given.    *    History of GI bleed.  EGD in 12/2016 with hemostatic therapy to an area of bleeding within Barrett's esophagus.  Nonbleeding duodenal varices noted and ultimately underwent TIPS/BRTO. Dopplers show diminished velocities within TIPS c/w 12/6.   Set up for surveillance EGD 08/09/16 in [place].    *   Macrocytic anemia.  *    Thrombocytopenia.  *    Coagulopathy.    PLAN:     *  Does he need Zosyn, Vanc?  Although a code sepsis was called with elevated Lactate, there is no clear infection.    *  Increasing Lactulose to 20 gm tid for now.  continue Rifaximin.   Add vitamin K x 3 days  *  spoke with his spouse re: palliative care GOC discussion once pt back to baseline and able to participate.    *  Need of IR to study TIPS further with venography and pressure measurement?  *  Cbc, coags, cbc, ammonia in am.     Christian Swanson  07/30/2017, 2:24 PM Pager: [phone]  I have reviewed the entire case in detail with the above APP and discussed the plan in detail.  Therefore, I agree with the diagnoses recorded above. In addition,  I have personally interviewed and examined the patient and have personally reviewed any abdominal/pelvic CT scan images.  My additional thoughts are as follows:  Decompensated cirrhosis, apparently due to systemic infection yet to be located.  He has broad-spectrum antibiotic coverage including for SBP.  This decompensation has clearly exacerbated his hepatic encephalopathy.  He is in acute renal failure as well.  He is receiving albumin every 6 hours, but I have discontinued his diuretics. I have also  increased his lactulose dose because he has reportedly only had one bowel movement today.  At the moment, I would not consider any intervention to address the flow changes seen on the Doppler imaging.  His condition remains guarded, and he is not a liver transplant candidate at his age.  Christian Swanson Pager [phone]  Mon-Fri 8a-5p [phone] after 5p, weekends, holidays

## 2017-07-30 NOTE — Progress Notes (Signed)
Latest k level-6.7 MD aware with orders.

## 2017-07-30 NOTE — Progress Notes (Signed)
Restless pulling out o2 nrm and desats to70's, explained the need  for o2, MD aware ,seen pt. at once chest xray done, lasix 40 iv given, cont. To monitor.

## 2017-07-30 NOTE — Progress Notes (Signed)
Pt . Is not in his room on endorsement time, went down for US of the abd.

## 2017-07-31 LAB — URINE CULTURE: Culture: <10000 — AB

## 2017-07-31 LAB — GLUCOSE, CAPILLARY: GLUCOSE-CAPILLARY: 77 mg/dL (ref 65–99)

## 2017-07-31 MED ORDER — LORAZEPAM 2 MG/ML IJ SOLN: 1.0000 mg | Freq: Once | INTRAMUSCULAR | Status: DC

## 2017-07-31 MED ORDER — MORPHINE SULFATE (PF) 2 MG/ML IV SOLN
INTRAVENOUS | Status: AC
  Filled 2017-07-31: qty 1

## 2017-07-31 MED ORDER — MORPHINE SULFATE (PF) 2 MG/ML IV SOLN: 2.0000 mg | Freq: Once | INTRAVENOUS | Status: DC

## 2017-08-01 LAB — BLOOD CULTURE ID PANEL (REFLEXED)
ACINETOBACTER BAUMANNII: NOT DETECTED
CANDIDA ALBICANS: NOT DETECTED
CANDIDA PARAPSILOSIS: NOT DETECTED
CANDIDA TROPICALIS: NOT DETECTED
Candida glabrata: NOT DETECTED
Candida krusei: NOT DETECTED
ENTEROBACTERIACEAE SPECIES: NOT DETECTED
Enterobacter cloacae complex: NOT DETECTED
Enterococcus species: NOT DETECTED
Escherichia coli: NOT DETECTED
HAEMOPHILUS INFLUENZAE: NOT DETECTED
KLEBSIELLA OXYTOCA: NOT DETECTED
KLEBSIELLA PNEUMONIAE: NOT DETECTED
Listeria monocytogenes: NOT DETECTED
Neisseria meningitidis: NOT DETECTED
PROTEUS SPECIES: NOT DETECTED
Pseudomonas aeruginosa: NOT DETECTED
STAPHYLOCOCCUS SPECIES: NOT DETECTED
STREPTOCOCCUS SPECIES: NOT DETECTED
Serratia marcescens: NOT DETECTED
Staphylococcus aureus (BCID): NOT DETECTED
Streptococcus agalactiae: NOT DETECTED
Streptococcus pneumoniae: NOT DETECTED
Streptococcus pyogenes: NOT DETECTED

## 2017-08-03 LAB — CULTURE, BLOOD (ROUTINE X 2): Special Requests: ADEQUATE

## 2017-08-04 NOTE — Discharge Summary (Signed)
Death Summary  Christian Swanson Robling ZOX:[id number] DOB: 1946/07/09 DOA: 04/06/17  PCP: Daiva NakayamaPa, [facility]  Admit date: 04/06/17 Date of Death: 07/06/2017  Cause of Death: End-stage liver cirrhosis.  Sepsis due to candidemia.   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 Sherryll BurgerShah [facility].  [facility] it appears that the patient is not a candidate for transplant.  Patient presented with fever chills fatigue and weakness over the last 2 days.  Patient was found to have lactic acidosis, hyponatremia and hyperkalemia.  He was noted to be febrile.  Patient was hospitalized for further management.   Hospital Course:   Fever/Sepsis/candidemia Patient noted to be febrile.  He had eukocytosis.  Also noted to have lactic acidosis which could be from his liver dysfunction. Pro-calcitonin level was 0.53.  Blood cultures were pending.  After patient expired his culture data came back showing cryptococcus.   Patient was initially started on vancomycin and Zosyn.  Influenza PCR was negative.  UA did not show any infection.    Acute renal failure with hyponatremia hyperkalemia metabolic acidosis No old labs were noted in our system.  Care-everywhere was reviewed.  Results from blood work done in June were available.  Creatinine was 0.7 at that time.  Sodium 132.  Potassium 4.1.  Bicarbonate was 26.  Patient was noted to be on diuretics at home including amiodarone and furosemide.  Patient was also on lisinopril.  He was given 1 L fluid bolus in the ED. Subsequently patient was noted to be normotensive.  Case was briefly discussed with nephrology and patient was ordered albumin infusions.  EKG did not show any changes of hyperkalemia.    Potassium continued to be elevated.  Patient was given multiple doses of Kayexalate, D50 insulin and bicarbonate.  Urine sodium  level 37.  Ultrasound does not show any hydronephrosis.   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 Liver Doppler study shows patent TIPS.  Diuretics placed on hold. Xifaxan and lactulose was continued.  Ammonia level 50.  Total bilirubin 8.0.  Based on care-everywhere bilirubin was 3 in June.  Meld score appears to be 30.  Gastroenterology was consulted.  They felt that patient had decompensated liver cirrhosis.  Macrocytic anemia and thrombocytopenia No evidence for overt bleeding.  Low platelets most likely due to liver cirrhosis.  Platelet counts have been as low as 80s previously.  Patient also noted to be coagulopathic with INR of 3.14.  INR was 2.4 in May.    History of essential hypertension Blood pressures were monitored closely.  History of duodenal varices Stable.  No evidence of bleeding.  Status post TIPS.  Over the course of the day on 12/27 patient continued to get worse.  He became hypoxic.  He had not made much urine.  Chest x-ray suggested pulmonary edema.  Patient was given doses of Lasix.  Discussions were held with the patient regarding CODE STATUS. Also discussed with the patient's spouse who was at the bedside.  Patient made it very clear that he did not want of any kind of breathing machine.  Previously he has expressed a wish not to be resuscitated.  In view of all of the above discussions patient was made DNR.  During the course of the evening patient continued to get worse.  He subsequently expired on 07/30/2017 at 3:39 AM.  The results of significant diagnostics from this hospitalization (including imaging, microbiology, ancillary and laboratory) are listed below for reference.    Significant Diagnostic Studies: Dg Chest 2 View  Result Date: 07/27/2017 CLINICAL DATA:  liver disease, nausea, shortness of breath EXAM: CHEST  2 VIEW COMPARISON:  None available FINDINGS: Mild cardiomegaly with central vascular congestion. No focal  pneumonia, collapse or consolidation. Negative for edema, effusion or pneumothorax. Trachea is midline. Minor thoracic spondylosis. Patient is status post TIPS. IMPRESSION: Cardiomegaly with mild vascular congestion No acute chest process. Electronically Signed   By: Judie Petit.  Shick M.D.   On: 07/22/2017 18:32   US Abdomen Complete  Result Date: 07/30/2017 CLINICAL DATA:  Fever.  Cirrhosis.  History of TIPS in May 2018. EXAM: ABDOMEN ULTRASOUND COMPLETE COMPARISON:  07/09/2017 abdominal sonogram. FINDINGS: Gallbladder: No gallstones, significant pericholecystic fluid or definite gallbladder wall thickening visualized. Small amount of layering sludge in the gallbladder. No sonographic Murphy sign noted by sonographer. Common bile duct: Diameter: 3 mm Liver: Liver surface is diffusely irregular and liver parenchyma is diffusely coarsened in echotexture, compatible with cirrhosis. No liver mass demonstrated. Main portal vein is patent on color Doppler imaging with normal direction of blood flow towards the liver. IVC: No abnormality visualized. Pancreas: Visualized portion unremarkable. Spleen: Mildly enlarged spleen (14.4 cm length, 547 cc volume). No splenic mass. Right Kidney: Length: 12.1 cm. Echogenicity within normal limits. No mass or hydronephrosis visualized. Left Kidney: Length: 13.4 cm. Echogenicity within normal limits. No mass or hydronephrosis visualized. Abdominal aorta: No aneurysm visualized. Other findings: None. IMPRESSION: 1. Hepatic cirrhosis.  No liver mass. 2. Mild splenomegaly. 3. Gallbladder sludge.  No cholelithiasis. Electronically Signed   By: Delbert Phenix M.D.   On: 07/30/2017 08:41   Dg Chest Port 1 View  Result Date: 07/30/2017 CLINICAL DATA:  Hypoxia EXAM: PORTABLE CHEST 1 VIEW COMPARISON:  07/23/2017 FINDINGS: There is moderate cardiac enlargement. Diffuse increased interstitial markings are identified bilaterally which appear progressed from previous exam, favor pulmonary edema.  Small left pleural effusion is new from previous exam. IMPRESSION: 1. Mild to moderate pulmonary edema and left pleural effusion. New from previous exam. Electronically Signed   By: Signa Kell M.D.   On: 07/30/2017 16:09   US Liver Doppler  Result Date: 07/30/2017 CLINICAL DATA:  Fever. TIPS placement May 2018. Cirrhosis. Evaluate patency. EXAM: DUPLEX ULTRASOUND OF LIVER TECHNIQUE: Color and duplex Doppler ultrasound was performed to evaluate the hepatic in-flow and out-flow vessels. Technologist describes technically difficult examination secondary to patient confusion and inability to cooperate with breathing instructions. COMPARISON:  None. FINDINGS: Portal Vein Velocities Main:  60 cm/sec, hepatopetal Right:  Not identified Left:  42  cm/sec, hepatofugal TIPS velocities (all antegrade hepatofugal) Proximal 198 cm/sec Mid 201 cm/sec Distal 169 cm/sec Hepatic Vein Velocities Right:    Not visualized Middle:  40 cm/sec, hepatofugal Left:  89 cm/sec, hepatofugal Hepatic Artery Velocity:  247 cm/sec Splenic Vein Velocity:  Not visualized Superior mesenteric vein velocity 62 cm/sec Varices: None identified Ascites: Trace perihepatic IMPRESSION: 1. Patent TIPS shunt. Elevated velocities through the stent since prior study 19 days ago. If there is clinical concern of inadequate TIPS function, consider venography and pressure measurement, possible revision. Electronically Signed   By: Corlis Leak M.D.   On: 07/30/2017 10:19   US Liver Doppler  Result Date: 07/09/2017 CLINICAL DATA:  72 year old male with a history of TIPS EXAM: DUPLEX ULTRASOUND OF LIVER AND TIPS SHUNT TECHNIQUE: Color and duplex Doppler ultrasound was performed to  evaluate the hepatic in-flow and out-flow vessels. COMPARISON:  No prior for comparison FINDINGS: Portal Vein Velocities Main:  60 cm/sec Right:  41 cm/sec Left:  21 cm/sec TIPS Stent Velocities Proximal:  152 cm/sec Mid:  86 Distal:  72 cm/sec Hepatic Vein Velocities Right:  169  cm/sec Mid:  21 cm/sec Left:  29 cm/sec Splenic Vein: 42 cm per sec Superior Mesenteric Vein: 111 cm per sec Hepatic Artery: 250 centimeter/seconds Ascities: No significant ascites Varices: No visualized varices Other findings: None IMPRESSION: Patent TIPS stent. Electronically Signed   By: Gilmer MorJaime  Wagner D.O.   On: 07/09/2017 10:03   [place] Abdomen Limited Ruq  Result Date: 07/09/2017 CLINICAL DATA:  Portal hypertension, prior TIPS EXAM: ULTRASOUND ABDOMEN LIMITED RIGHT UPPER QUADRANT COMPARISON:  None. FINDINGS: Gallbladder: No gallstones or wall thickening visualized. No sonographic Murphy sign noted by sonographer. Common bile duct: Diameter: Normal caliber, 3 mm Liver: Nodular contours and coarsened echotexture compatible with cirrhosis. Portal vein is patent on color Doppler imaging with normal direction of blood flow towards the liver. IMPRESSION: Changes of cirrhosis.  No focal hepatic abnormality. Electronically Signed   By: Charlett NoseKevin  Dover M.D.   On: 07/09/2017 09:37    Microbiology: Recent Results (from the past 240 hour(s))  Blood culture (routine x 2)     Status: Abnormal   Collection Time: 07/15/2017  8:27 PM  Result Value Ref Range Status   Specimen Description BLOOD RIGHT ANTECUBITAL  Final   Special Requests   Final    IN BOTH AEROBIC AND ANAEROBIC BOTTLES Blood Culture adequate volume   Culture  Setup Time   Final    YEAST AEROBIC BOTTLE ONLY CRITICAL RESULT CALLED TO, READ BACK BY AND VERIFIED WITH: PATIENT DISCHARGED OR EXPIRED    Culture CRYPTOCOCCUS NEOFORMANS (A)  Final   Report Status 08/03/2017 FINAL  Final  Blood culture (routine x 2)     Status: Abnormal   Collection Time: 07/23/2017  8:32 PM  Result Value Ref Range Status   Specimen Description BLOOD LEFT HAND  Final   Special Requests IN PEDIATRIC BOTTLE Blood Culture adequate volume  Final   Culture  Setup Time   Final    BUDDING YEAST SEEN IN PEDIATRIC BOTTLE CRITICAL RESULT CALLED TO, READ BACK BY AND VERIFIED WITH:  PATIENT DISCHARGED OR EXPIRED    Culture CRYPTOCOCCUS NEOFORMANS (A)  Final   Report Status 08/03/2017 FINAL  Final  Blood Culture ID Panel (Reflexed)     Status: None   Collection Time: 07/16/2017  8:32 PM  Result Value Ref Range Status   Enterococcus species NOT DETECTED NOT DETECTED Final   Listeria monocytogenes NOT DETECTED NOT DETECTED Final   Staphylococcus species NOT DETECTED NOT DETECTED Final   Staphylococcus aureus NOT DETECTED NOT DETECTED Final   Streptococcus species NOT DETECTED NOT DETECTED Final   Streptococcus agalactiae NOT DETECTED NOT DETECTED Final   Streptococcus pneumoniae NOT DETECTED NOT DETECTED Final   Streptococcus pyogenes NOT DETECTED NOT DETECTED Final   Acinetobacter baumannii NOT DETECTED NOT DETECTED Final   Enterobacteriaceae species NOT DETECTED NOT DETECTED Final   Enterobacter cloacae complex NOT DETECTED NOT DETECTED Final   Escherichia coli NOT DETECTED NOT DETECTED Final   Klebsiella oxytoca NOT DETECTED NOT DETECTED Final   Klebsiella pneumoniae NOT DETECTED NOT DETECTED Final   Proteus species NOT DETECTED NOT DETECTED Final   Serratia marcescens NOT DETECTED NOT DETECTED Final   Haemophilus influenzae NOT DETECTED NOT DETECTED Final   Neisseria meningitidis NOT  DETECTED NOT DETECTED Final   Pseudomonas aeruginosa NOT DETECTED NOT DETECTED Final   Candida albicans NOT DETECTED NOT DETECTED Final   Candida glabrata NOT DETECTED NOT DETECTED Final   Candida krusei NOT DETECTED NOT DETECTED Final   Candida parapsilosis NOT DETECTED NOT DETECTED Final   Candida tropicalis NOT DETECTED NOT DETECTED Final  Urine culture     Status: Abnormal   Collection Time: 07/14/2017  9:09 PM  Result Value Ref Range Status   Specimen Description URINE, CLEAN CATCH  Final   Special Requests NONE  Final   Culture <10,000 COLONIES/mL (A)  Final   Report Status 08/06/17 FINAL  Final  MRSA PCR Screening     Status: None   Collection Time: 07/30/17  1:53 AM    Result Value Ref Range Status   MRSA by PCR NEGATIVE NEGATIVE Final    Comment:        The GeneXpert MRSA Assay (FDA approved for NASAL specimens only), is one component of a comprehensive MRSA colonization surveillance program. It is not intended to diagnose MRSA infection nor to guide or monitor treatment for MRSA infections.      Labs: Basic Metabolic Panel: Recent Labs  Lab 07/04/2017 1747 07/30/17 0242 07/30/17 0932 07/30/17 1652 07/30/17 2151  NA 127* 125* 127* 127* 128*  K 6.1* 5.8* 6.7* 6.1* 5.6*  CL 98* 100* 99* 99* 98*  CO2 17* 19* 19* 15* 20*  GLUCOSE 111* 117* 90 91 93  BUN 24* 29* 34* 37* 42*  CREATININE 1.43* 1.40* 1.47* 1.69* 1.84*  CALCIUM 9.0 8.5* 8.6* 8.9 8.6*   Liver Function Tests: Recent Labs  Lab 07/19/2017 1747 07/30/17 0242  AST 105* 96*  ALT 48 42  ALKPHOS 137* 112  BILITOT 8.2* 8.0*  PROT 6.6 5.7*  ALBUMIN 2.4* 2.2*   Recent Labs  Lab 08/03/2017 1747  LIPASE 27   Recent Labs  Lab 07/25/2017 2049  AMMONIA 50*   CBC: Recent Labs  Lab 07/18/2017 1747 07/30/17 0242  WBC 16.5* 12.5*  NEUTROABS  --  9.3*  HGB 8.9* 8.7*  HCT 25.9* 24.5*  MCV 102.8* 102.1*  PLT 86* 58*   CBG: Recent Labs  Lab 07/30/17 1241 07/30/17 1810 08-06-2017 0335  GLUCAP 142* 86 77   Urinalysis    Component Value Date/Time   COLORURINE AMBER (A) 08/03/2017 2109   APPEARANCEUR CLEAR 07/16/2017 2109   LABSPEC 1.015 07/28/2017 2109   PHURINE 5.0 07/05/2017 2109   GLUCOSEU NEGATIVE 07/28/2017 2109   HGBUR MODERATE (A) 08/02/2017 2109   BILIRUBINUR NEGATIVE 07/15/2017 2109   KETONESUR NEGATIVE 07/16/2017 2109   PROTEINUR NEGATIVE 07/19/2017 2109   NITRITE NEGATIVE 07/18/2017 2109   LEUKOCYTESUR NEGATIVE 07/15/2017 2109      Osvaldo Shipper  [facility] 08/03/2017, 4:14 PM

## 2017-08-04 NOTE — Progress Notes (Signed)
   07-09-2017 0500  Clinical Encounter Type  Visited With Patient and family together;Health care provider  Visit Type Death  Referral From Nurse  Consult/Referral To Chaplain  Spiritual Encounters  Spiritual Needs Prayer;Grief support  Stress Factors  Family Stress Factors Loss   Responded to a page to [facility] for the death of a patient and to support the spouse.  Was able to provide comfort and compassion for the spouse as he shared about their 44 years together.  Was able to help him talk through what is next in terms of making arrangements and getting support.  He was very appreciative of the support from the staff on 2C.   Chaplain Agustin CreeNewton Alvin Diffee

## 2017-08-04 NOTE — Progress Notes (Signed)
Received call from Charge RN, patient was hypotensive, bradycardiac, and unresponsive, patient was DNR.  I asked that primary team provided be paged, I with another patient with a medical emergency.  I did instruct RN to give fluid bolus and to inform the provider that as well.   When I called back to check on patient, RN did inform that patient did expire, primary service aware.   Call Time 325

## 2017-08-04 NOTE — Progress Notes (Signed)
Pt became non responsive, began agonal breathing, HR and BP began to decrease. Pt became cool and pupils became sluggish. Pt was placed on non rebreather, EKG obtained, and MD notified. Rapid response called. 2 mg of morphine given for pt comfort. HR dropped to 20s then asystole. Absent breath sounds and heart sounds ascultated for a full minute by 2 RNs. Time of death [id number]. MD notified of pt expiration. Emotional support provided to family at bedside, chaplin notified per pt request. [facility] called [phone](07/24/2017-016). Pt not a candidate for donation.

## 2017-08-04 DEATH — deceased

## 2018-08-06 IMAGING — US US HEPATIC LIVER DOPPLER
1 series · 14 of 25 positions shown · non-contrast
Comparison: No prior for comparison

CLINICAL DATA: 71-year-old male with a history of TIPS

EXAM:
DUPLEX ULTRASOUND OF LIVER AND TIPS SHUNT
TECHNIQUE: Color and duplex Doppler ultrasound was performed to evaluate the
hepatic in-flow and out-flow vessels.

[Series 1: us hepatic liver doppler · 0.22mm/px · 14 of 53 slices shown]
[im 1/53]
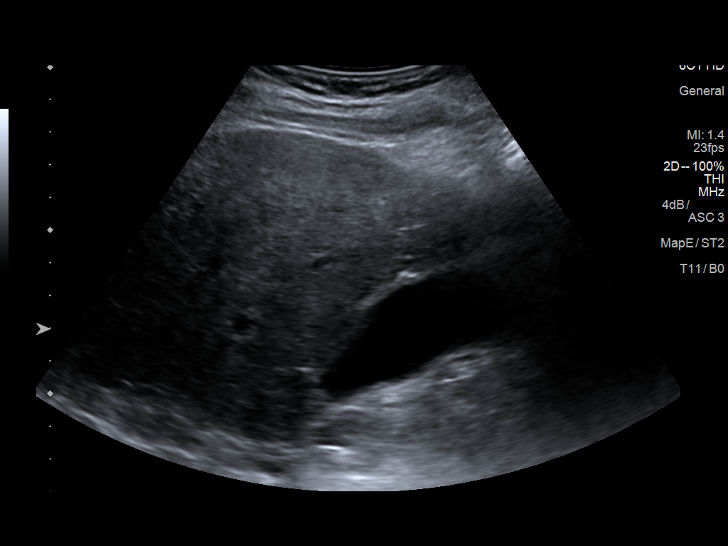
[im 5/53]
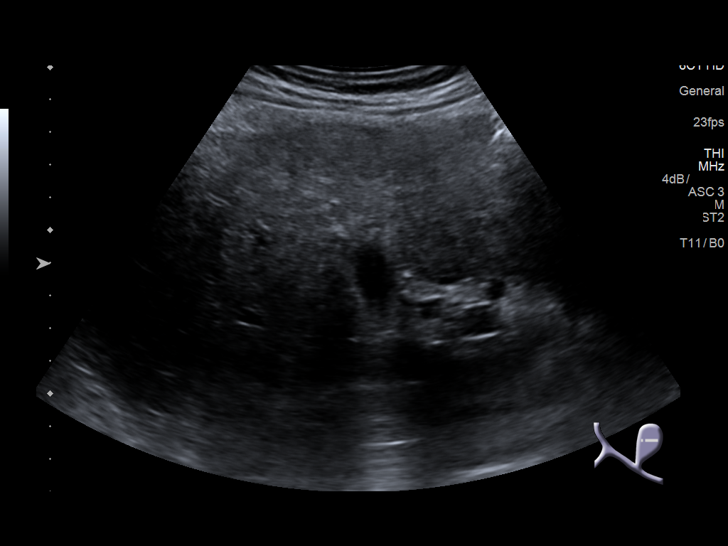
[im 9/53]
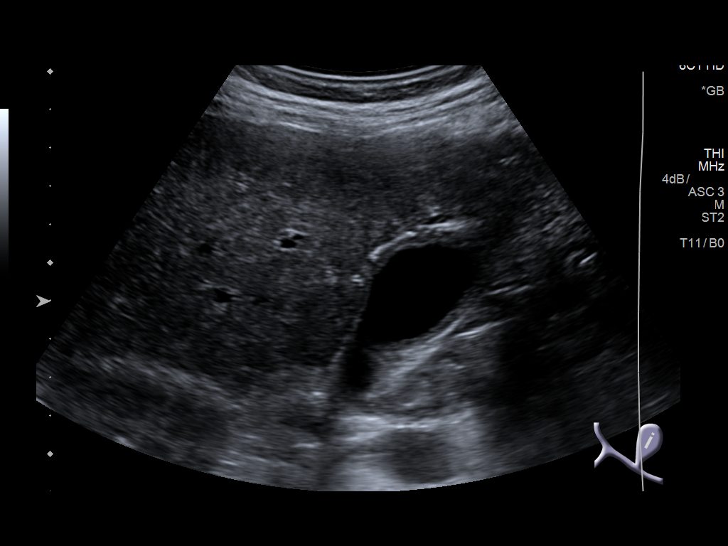
[im 14/53]
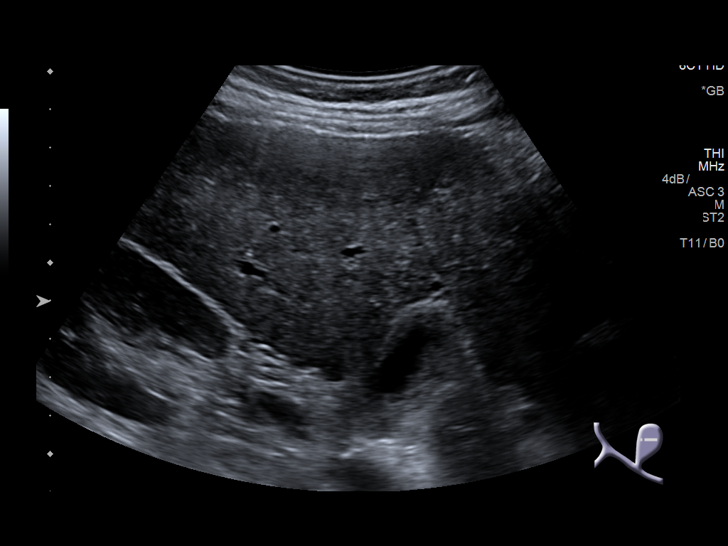
[im 18/53]
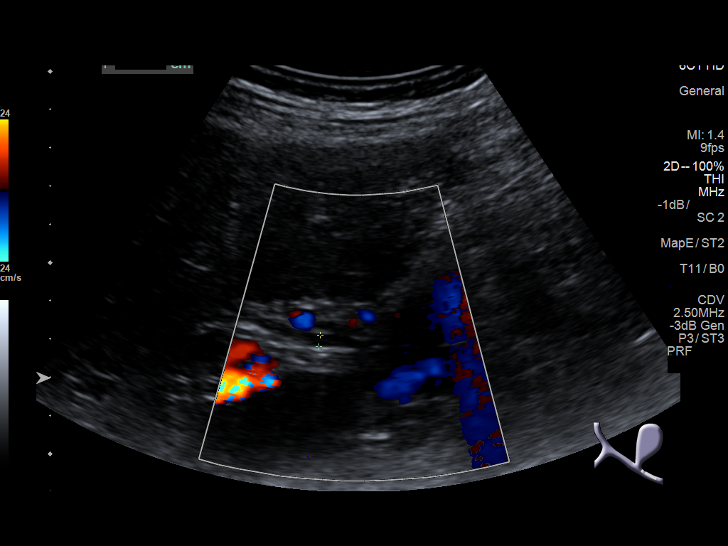
[im 20/53]
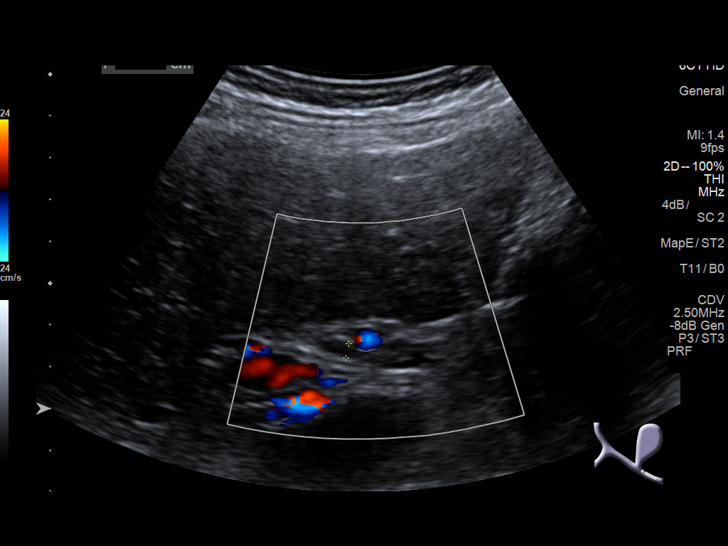
[im 24/53]
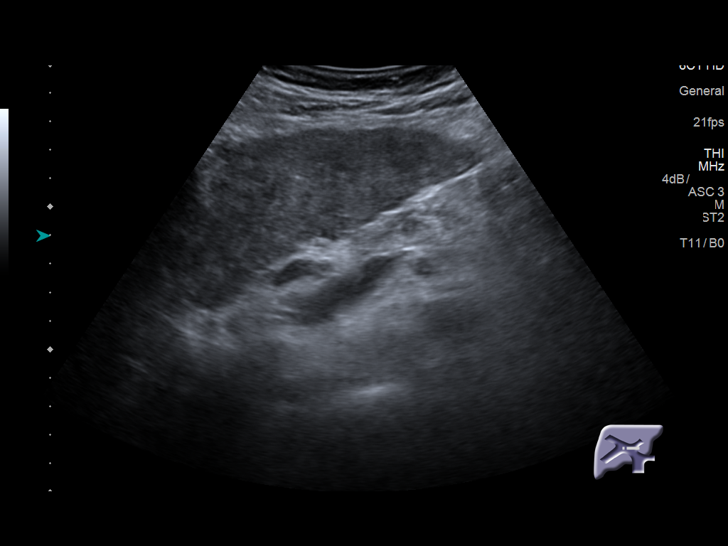
[im 29/53]
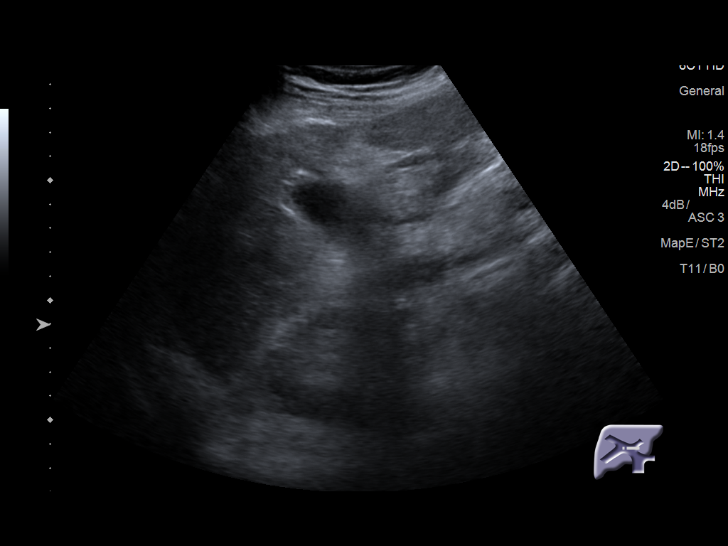
[im 33/53]
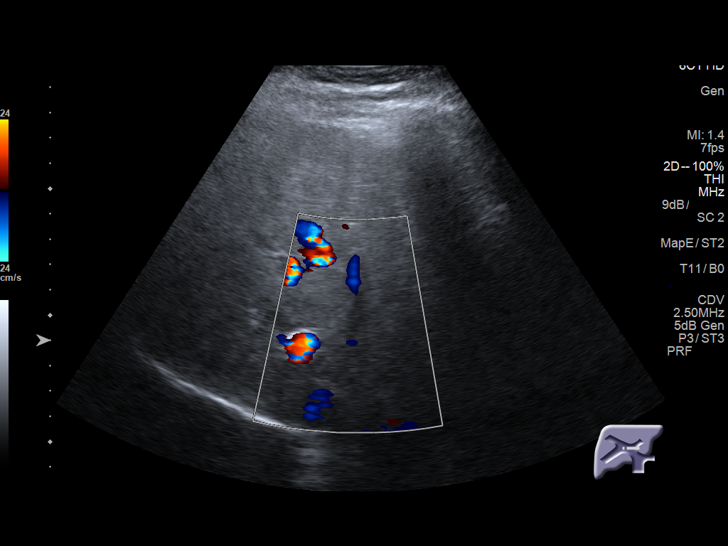
[im 35/53]
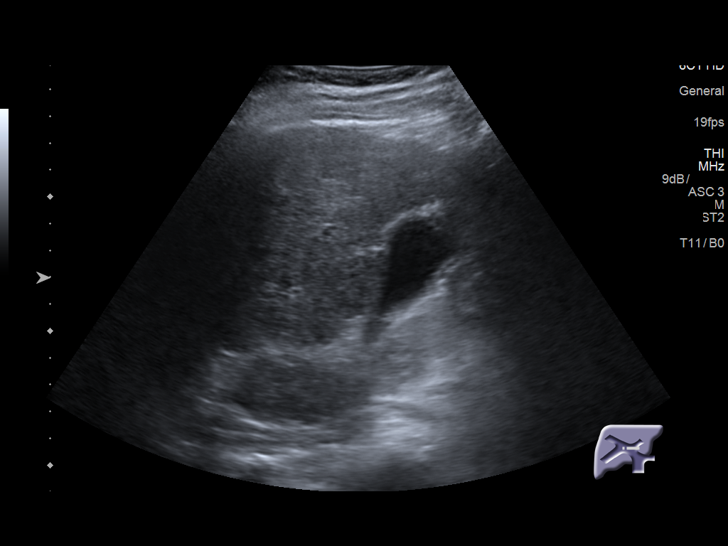
[im 40/53]
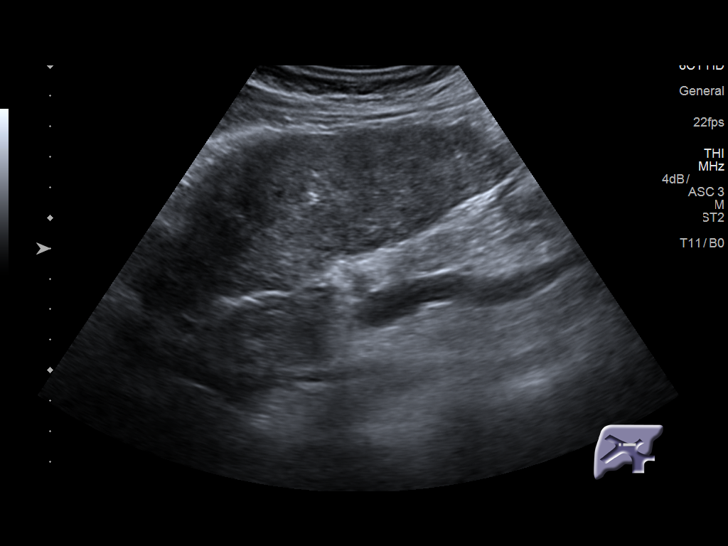
[im 44/53]
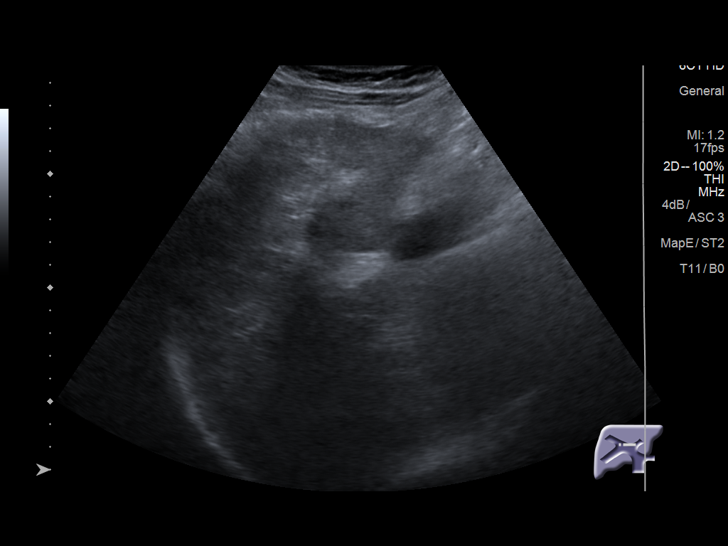
[im 48/53]
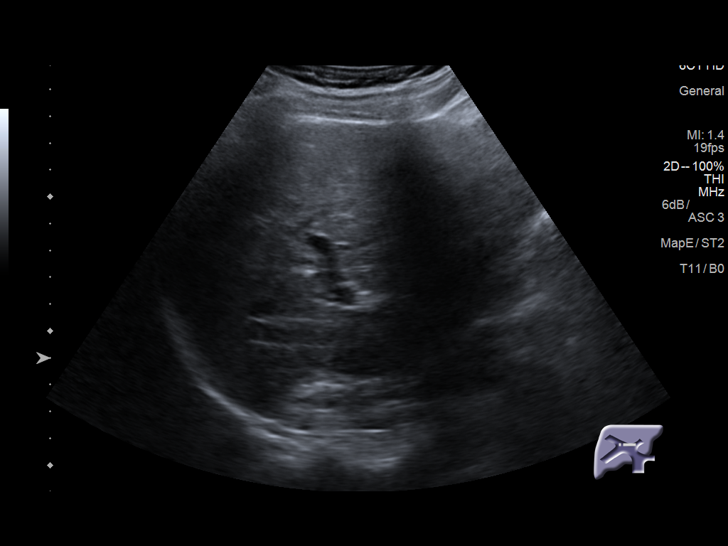
[im 53/53]
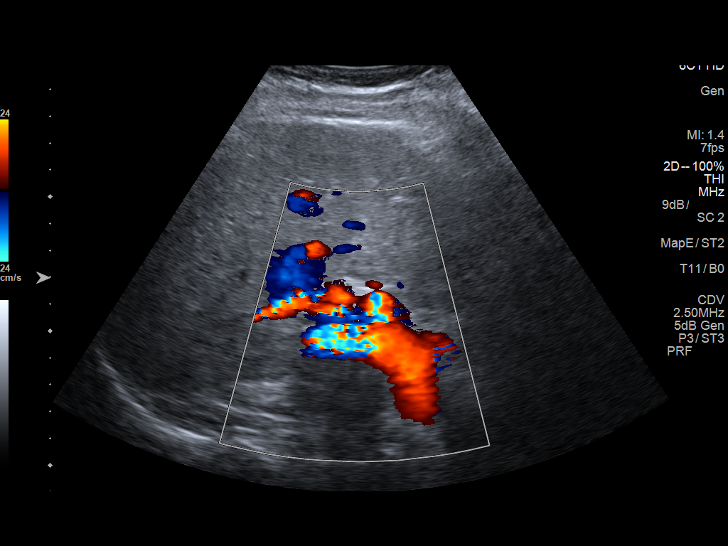

[14 of 25 positions shown; findings below may reference images not displayed]

FINDINGS: Portal Vein Velocities

Main:  60 cm/sec

Right:  41 cm/sec

Left:  21 cm/sec

TIPS Stent Velocities

Proximal:  152 cm/sec

Mid:  86

Distal:  72 cm/sec

Hepatic Vein Velocities

Right:  169 cm/sec

Mid:  21 cm/sec

Left:  29 cm/sec

Splenic Vein: 42 cm per sec

Superior Mesenteric Vein: 111 cm per sec

Hepatic Artery: 250 centimeter/seconds

Ascities: No significant ascites

Varices: No visualized varices

Other findings: None
IMPRESSION: Patent TIPS stent.
